# Patient Record
Sex: Female | Born: 1985 | Race: Asian | Hispanic: No | Marital: Single | Smoking: Never smoker
Health system: Southern US, Community
[De-identification: ages and names within clinical notes are randomized; demographics above are authoritative.]

## PROBLEM LIST (undated history)

## (undated) ENCOUNTER — Inpatient Hospital Stay (HOSPITAL_COMMUNITY): Payer: Self-pay

## (undated) DIAGNOSIS — Z789 Other specified health status: Secondary | ICD-10-CM

## (undated) DIAGNOSIS — O329XX1 Maternal care for malpresentation of fetus, unspecified, fetus 1: Secondary | ICD-10-CM

## (undated) DIAGNOSIS — O329XX0 Maternal care for malpresentation of fetus, unspecified, not applicable or unspecified: Secondary | ICD-10-CM

## (undated) HISTORY — DX: Other specified health status: Z78.9

---

## 2009-07-19 ENCOUNTER — Other Ambulatory Visit: Admission: RE | Admit: 2009-07-19 | Discharge: 2009-07-19 | Payer: Self-pay | Admitting: Obstetrics and Gynecology

## 2010-01-22 ENCOUNTER — Inpatient Hospital Stay (HOSPITAL_COMMUNITY): Admission: AD | Admit: 2010-01-22 | Discharge: 2010-01-24 | Payer: Self-pay | Admitting: Obstetrics and Gynecology

## 2010-10-31 LAB — CBC
Hemoglobin: 10.8 g/dL — ABNORMAL LOW (ref 12.0–15.0)
MCHC: 32.3 g/dL (ref 30.0–36.0)
Platelets: 205 10*3/uL (ref 150–400)
RBC: 3.9 MIL/uL (ref 3.87–5.11)
RDW: 15.7 % — ABNORMAL HIGH (ref 11.5–15.5)
WBC: 13.4 10*3/uL — ABNORMAL HIGH (ref 4.0–10.5)

## 2010-10-31 LAB — RPR: RPR Ser Ql: NONREACTIVE

## 2011-06-06 LAB — ABO/RH: RH Type: POSITIVE

## 2011-06-06 LAB — HIV ANTIBODY (ROUTINE TESTING W REFLEX): HIV: NONREACTIVE

## 2011-06-06 LAB — ANTIBODY SCREEN: Antibody Screen: NEGATIVE

## 2011-06-08 ENCOUNTER — Other Ambulatory Visit (HOSPITAL_COMMUNITY): Payer: Self-pay | Admitting: Physician Assistant

## 2011-06-08 DIAGNOSIS — Z3689 Encounter for other specified antenatal screening: Secondary | ICD-10-CM

## 2011-06-13 ENCOUNTER — Ambulatory Visit (HOSPITAL_COMMUNITY)
Admission: RE | Admit: 2011-06-13 | Discharge: 2011-06-13 | Disposition: A | Discharge status: Home / Self Care | Payer: Medicaid Other | Source: Ambulatory Visit | Attending: Physician Assistant | Admitting: Physician Assistant

## 2011-06-13 DIAGNOSIS — Z363 Encounter for antenatal screening for malformations: Secondary | ICD-10-CM | POA: Insufficient documentation

## 2011-06-13 DIAGNOSIS — Z1389 Encounter for screening for other disorder: Secondary | ICD-10-CM | POA: Insufficient documentation

## 2011-06-13 DIAGNOSIS — O358XX0 Maternal care for other (suspected) fetal abnormality and damage, not applicable or unspecified: Secondary | ICD-10-CM | POA: Insufficient documentation

## 2011-06-13 DIAGNOSIS — Z3689 Encounter for other specified antenatal screening: Secondary | ICD-10-CM

## 2011-08-15 NOTE — L&D Delivery Note (Cosign Needed)
Delivery Note At 8:13 AM a viable female was delivered via Vaginal, Spontaneous Delivery (Presentation: Right Occiput Anterior).  APGAR: 9, 9; weight 7 lb 3.7 oz (3280 g).   Placenta status: Intact, Spontaneous.  Cord: 3 vessels with the following complications: None.    Anesthesia: None  Episiotomy: n/a Lacerations: 1st degree;Perineal Suture Repair: 3.0 vicryl rapide Est. Blood Loss (mL): 350  Mom to postpartum.  Baby to nursery-stable.  Plans to breastfeed, unsure about contraception or circumcision.  Cathie Beams, CNM in attendance of birth  Joellyn Haff, [place] 09/22/2011, 9:00 AM

## 2011-08-25 ENCOUNTER — Other Ambulatory Visit: Payer: Self-pay | Admitting: Family Medicine

## 2011-08-25 DIAGNOSIS — N644 Mastodynia: Secondary | ICD-10-CM

## 2011-08-25 DIAGNOSIS — N6320 Unspecified lump in the left breast, unspecified quadrant: Secondary | ICD-10-CM

## 2011-08-30 LAB — GC/CHLAMYDIA PROBE AMP, GENITAL: Chlamydia: NEGATIVE

## 2011-08-31 ENCOUNTER — Ambulatory Visit
Admission: RE | Admit: 2011-08-31 | Discharge: 2011-08-31 | Disposition: A | Discharge status: Home / Self Care | Payer: Medicaid Other | Source: Ambulatory Visit | Attending: Family Medicine | Admitting: Family Medicine

## 2011-08-31 DIAGNOSIS — N644 Mastodynia: Secondary | ICD-10-CM

## 2011-08-31 DIAGNOSIS — N6320 Unspecified lump in the left breast, unspecified quadrant: Secondary | ICD-10-CM

## 2011-09-22 ENCOUNTER — Inpatient Hospital Stay (HOSPITAL_COMMUNITY)
Admission: AD | Admit: 2011-09-22 | Discharge: 2011-09-24 | DRG: 775 | Disposition: A | Discharge status: Home / Self Care | Payer: Medicaid Other | Source: Ambulatory Visit | Attending: Family Medicine | Admitting: Family Medicine

## 2011-09-22 ENCOUNTER — Encounter (HOSPITAL_COMMUNITY): Payer: Self-pay | Admitting: *Deleted

## 2011-09-22 DIAGNOSIS — O9902 Anemia complicating childbirth: Secondary | ICD-10-CM | POA: Diagnosis present

## 2011-09-22 DIAGNOSIS — IMO0001 Reserved for inherently not codable concepts without codable children: Secondary | ICD-10-CM

## 2011-09-22 DIAGNOSIS — Z3490 Encounter for supervision of normal pregnancy, unspecified, unspecified trimester: Secondary | ICD-10-CM

## 2011-09-22 DIAGNOSIS — D569 Thalassemia, unspecified: Secondary | ICD-10-CM

## 2011-09-22 DIAGNOSIS — D582 Other hemoglobinopathies: Secondary | ICD-10-CM

## 2011-09-22 LAB — CBC
HCT: 28.8 % — ABNORMAL LOW (ref 36.0–46.0)
Hemoglobin: 10.2 g/dL — ABNORMAL LOW (ref 12.0–15.0)
MCV: 63.3 fL — ABNORMAL LOW (ref 78.0–100.0)
WBC: 10.5 10*3/uL (ref 4.0–10.5)

## 2011-09-22 LAB — ABO/RH: ABO/RH(D): O POS

## 2011-09-22 MED ORDER — ZOLPIDEM TARTRATE 5 MG PO TABS: 5.0000 mg | ORAL_TABLET | Freq: Every evening | ORAL | Status: DC | PRN

## 2011-09-22 MED ORDER — METHYLERGONOVINE MALEATE 0.2 MG PO TABS
0.2000 mg | ORAL_TABLET | ORAL | Status: DC | PRN
Start: 2011-09-22 — End: 2011-09-24

## 2011-09-22 MED ORDER — OXYCODONE-ACETAMINOPHEN 5-325 MG PO TABS: 1.0000 | ORAL_TABLET | ORAL | Status: DC | PRN

## 2011-09-22 MED ORDER — SENNOSIDES-DOCUSATE SODIUM 8.6-50 MG PO TABS
2.0000 | ORAL_TABLET | Freq: Every day | ORAL | Status: DC
  Administered 2011-09-22 – 2011-09-23 (×2): 2 via ORAL

## 2011-09-22 MED ORDER — DIPHENHYDRAMINE HCL 25 MG PO CAPS: 25.0000 mg | ORAL_CAPSULE | Freq: Four times a day (QID) | ORAL | Status: DC | PRN

## 2011-09-22 MED ORDER — NALBUPHINE SYRINGE 5 MG/0.5 ML: 5.0000 mg | INJECTION | INTRAMUSCULAR | Status: DC | PRN

## 2011-09-22 MED ORDER — IBUPROFEN 600 MG PO TABS: 600.0000 mg | ORAL_TABLET | Freq: Four times a day (QID) | ORAL | Status: DC | PRN

## 2011-09-22 MED ORDER — ONDANSETRON HCL 4 MG PO TABS: 4.0000 mg | ORAL_TABLET | ORAL | Status: DC | PRN

## 2011-09-22 MED ORDER — PRENATAL MULTIVITAMIN CH
1.0000 | ORAL_TABLET | Freq: Every day | ORAL | Status: DC
  Administered 2011-09-23: 1 via ORAL
  Filled 2011-09-22 (×2): qty 1

## 2011-09-22 MED ORDER — LACTATED RINGERS IV SOLN: 500.0000 mL | INTRAVENOUS | Status: DC | PRN

## 2011-09-22 MED ORDER — BENZOCAINE-MENTHOL 20-0.5 % EX AERO
INHALATION_SPRAY | CUTANEOUS | Status: AC
  Filled 2011-09-22: qty 56

## 2011-09-22 MED ORDER — TETANUS-DIPHTH-ACELL PERTUSSIS 5-2.5-18.5 LF-MCG/0.5 IM SUSP
0.5000 mL | Freq: Once | INTRAMUSCULAR | Status: AC
  Administered 2011-09-23: 0.5 mL via INTRAMUSCULAR
  Filled 2011-09-22: qty 0.5

## 2011-09-22 MED ORDER — CITRIC ACID-SODIUM CITRATE 334-500 MG/5ML PO SOLN: 30.0000 mL | ORAL | Status: DC | PRN

## 2011-09-22 MED ORDER — IBUPROFEN 600 MG PO TABS
600.0000 mg | ORAL_TABLET | Freq: Four times a day (QID) | ORAL | Status: DC
  Administered 2011-09-22 – 2011-09-24 (×8): 600 mg via ORAL
  Filled 2011-09-22 (×9): qty 1

## 2011-09-22 MED ORDER — BISACODYL 10 MG RE SUPP: 10.0000 mg | Freq: Every day | RECTAL | Status: DC | PRN

## 2011-09-22 MED ORDER — METHYLERGONOVINE MALEATE 0.2 MG/ML IJ SOLN: 0.2000 mg | INTRAMUSCULAR | Status: DC | PRN

## 2011-09-22 MED ORDER — ACETAMINOPHEN 325 MG PO TABS: 650.0000 mg | ORAL_TABLET | ORAL | Status: DC | PRN

## 2011-09-22 MED ORDER — LACTATED RINGERS IV SOLN
INTRAVENOUS | Status: DC
  Administered 2011-09-22: 06:00:00 via INTRAVENOUS

## 2011-09-22 MED ORDER — ONDANSETRON HCL 4 MG/2ML IJ SOLN: 4.0000 mg | INTRAMUSCULAR | Status: DC | PRN

## 2011-09-22 MED ORDER — OXYTOCIN 20 UNITS IN LACTATED RINGERS INFUSION - SIMPLE: 125.0000 mL/h | Freq: Once | INTRAVENOUS | Status: DC

## 2011-09-22 MED ORDER — SIMETHICONE 80 MG PO CHEW: 80.0000 mg | CHEWABLE_TABLET | ORAL | Status: DC | PRN

## 2011-09-22 MED ORDER — ONDANSETRON HCL 4 MG/2ML IJ SOLN: 4.0000 mg | Freq: Four times a day (QID) | INTRAMUSCULAR | Status: DC | PRN

## 2011-09-22 MED ORDER — WITCH HAZEL-GLYCERIN EX PADS
1.0000 "application " | MEDICATED_PAD | CUTANEOUS | Status: DC | PRN
  Administered 2011-09-22 – 2011-09-24 (×2): 1 via TOPICAL

## 2011-09-22 MED ORDER — LANOLIN HYDROUS EX OINT: TOPICAL_OINTMENT | CUTANEOUS | Status: DC | PRN

## 2011-09-22 MED ORDER — FLEET ENEMA 7-19 GM/118ML RE ENEM: 1.0000 | ENEMA | RECTAL | Status: DC | PRN

## 2011-09-22 MED ORDER — OXYTOCIN BOLUS FROM INFUSION
500.0000 mL | Freq: Once | INTRAVENOUS | Status: AC
  Administered 2011-09-22: 500 mL via INTRAVENOUS
  Filled 2011-09-22: qty 1000
  Filled 2011-09-22: qty 500

## 2011-09-22 MED ORDER — FLEET ENEMA 7-19 GM/118ML RE ENEM: 1.0000 | ENEMA | Freq: Every day | RECTAL | Status: DC | PRN

## 2011-09-22 MED ORDER — LIDOCAINE HCL (PF) 1 % IJ SOLN
30.0000 mL | INTRAMUSCULAR | Status: DC | PRN
  Administered 2011-09-22: 30 mL via SUBCUTANEOUS
  Filled 2011-09-22: qty 30

## 2011-09-22 MED ORDER — OXYCODONE-ACETAMINOPHEN 5-325 MG PO TABS
1.0000 | ORAL_TABLET | ORAL | Status: DC | PRN
  Administered 2011-09-22 – 2011-09-23 (×3): 1 via ORAL
  Filled 2011-09-22 (×3): qty 1

## 2011-09-22 MED ORDER — BENZOCAINE-MENTHOL 20-0.5 % EX AERO
1.0000 "application " | INHALATION_SPRAY | CUTANEOUS | Status: DC | PRN
  Administered 2011-09-22 – 2011-09-24 (×2): 1 via TOPICAL

## 2011-09-22 MED ORDER — DIBUCAINE 1 % RE OINT
1.0000 "application " | TOPICAL_OINTMENT | RECTAL | Status: DC | PRN
  Administered 2011-09-22 – 2011-09-24 (×2): 1 via RECTAL
  Filled 2011-09-22 (×2): qty 28

## 2011-09-22 NOTE — Progress Notes (Signed)
contractions 

## 2011-09-22 NOTE — H&P (Signed)
Brittany Wilson is a 26 y.o. G8P1001 [place] female at [redacted]w[redacted]d presenting in active labor.  Onset of care at 18.6wks at [facility].  H/O GDM w/ 1st pregnancy, 1st trimester 1hr glucola elevated this pregnancy, but passed only 1 abnormal value on 3hr and subsequent normal 28wk 1hr.  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   Maternal Medical History:  Reason for admission: Reason for admission: contractions.  Contractions: Onset was 3-5 hours ago.   Frequency: regular.   Perceived severity is strong.    Fetal activity: Perceived fetal activity is normal.   Last perceived fetal movement was within the past hour.    Prenatal complications: no prenatal complications Prenatal Complications - Diabetes: none. H/o GDM; 1st trimester 1hr this pregnancy 136, 3hr 62, 129, 200, 89; 28wk 1hr 97  OB History    Grav Para Term Preterm Abortions TAB SAB Ect Mult Living   2 1 1       1      History reviewed. No pertinent past medical history. History reviewed. No pertinent past surgical history. Family History: family history is not on file. Social History:  reports that she has never smoked. She does not have any smokeless tobacco history on file. She reports that she does not drink alcohol or use illicit drugs.  Review of Systems  Constitutional: Negative.   HENT: Negative.   Eyes: Negative.   Respiratory: Negative.   Cardiovascular: Negative.   Gastrointestinal: Positive for abdominal pain (UCs).  Genitourinary: Negative for dysuria, urgency, frequency, hematuria and flank pain.       Bloody show  Musculoskeletal: Negative.   Skin: Negative.   Neurological: Negative.   Endo/Heme/Allergies: Negative.   Psychiatric/Behavioral: Negative.     Dilation: 6.5 Effacement (%): 90 Station: -1 Blood pressure 118/77, pulse 88, resp. rate 18, height 5' (1.524 m), weight 56.7 kg (125 lb), SpO2 99.00%. Maternal Exam:  Uterine Assessment: Contraction strength is firm.  Contraction  frequency is irregular.   Abdomen: Patient reports no abdominal tenderness. Introitus: Normal vulva. Normal vagina.    Physical Exam  Constitutional: She is oriented to person, place, and time. She appears well-developed and well-nourished.  HENT:  Head: Normocephalic.  Eyes: Pupils are equal, round, and reactive to light.  Neck: Normal range of motion.  Cardiovascular: Normal rate and regular rhythm.   Respiratory: Effort normal and breath sounds normal.  GI: Soft.       Gravid  Genitourinary: Vagina normal and uterus normal.  Musculoskeletal: Normal range of motion.  Neurological: She is alert and oriented to person, place, and time. She has normal reflexes.  Skin: Skin is warm and dry.  Psychiatric: She has a normal mood and affect. Her behavior is normal. Judgment and thought content normal.    Prenatal labs: ABO, Rh:   O+ Antibody:  Neg Rubella:  Immune RPR:   Neg HBsAg:   Neg HIV:   NR GBS:   Neg  Assessment/Plan: A: IUP @ 39.2wks     Active labor-intact membranes     GBS neg     Hgb EE E-Thalassemia     Anemia      P: Admit to BS     Expectant management     IV pain meds or epidural upon maternal request      Pt declines AROM at this time  POC reviewed w/ Cathie Beams, CNM       Joellyn Haff, SNM 09/22/2011, 5:57 AM

## 2011-09-22 NOTE — Progress Notes (Signed)
UR chart review completed.  

## 2011-09-23 NOTE — Progress Notes (Signed)
Post Partum Day #1 Subjective: no complaints and up ad lib; bottlefeeding; desires Depo for contraception; declines early d/c  Objective: Blood pressure 88/58, pulse 76, temperature 97.6 F (36.4 C), temperature source Oral, resp. rate 17, height 5' (1.524 m), weight 56.7 kg (125 lb), SpO2 98.00%, unknown if currently breastfeeding.  Physical Exam:  General: alert, cooperative and no distress Lochia: appropriate Uterine Fundus: firm    Basename 09/22/11 0620  HGB 10.2*  HCT 28.8*    Assessment/Plan: Plan for discharge tomorrow Will f/u at [facility] for depo inj   LOS: 1 day   Cam Hai 09/23/2011, 8:56 AM

## 2011-09-24 DIAGNOSIS — Z3490 Encounter for supervision of normal pregnancy, unspecified, unspecified trimester: Secondary | ICD-10-CM

## 2011-09-24 DIAGNOSIS — D582 Other hemoglobinopathies: Secondary | ICD-10-CM

## 2011-09-24 MED ORDER — IBUPROFEN 600 MG PO TABS: 600.0000 mg | ORAL_TABLET | Freq: Four times a day (QID) | ORAL | Status: AC

## 2011-09-24 MED ORDER — BENZOCAINE-MENTHOL 20-0.5 % EX AERO
INHALATION_SPRAY | CUTANEOUS | Status: AC
  Filled 2011-09-24: qty 56

## 2011-09-24 NOTE — Discharge Summary (Signed)
Obstetric Discharge Summary Reason for Admission: onset of labor Prenatal Procedures: NST Intrapartum Procedures: spontaneous vaginal delivery Postpartum Procedures: none Complications-Operative and Postpartum: none Hemoglobin  Date Value Range Status  09/22/2011 10.2* 12.0-15.0 (g/dL) Final     HCT  Date Value Range Status  09/22/2011 28.8* 36.0-46.0 (%) Final   Patient was admitted in active labor and delivered 2 hrs later.  At 8:13 AM a viable female was delivered via Vaginal, Spontaneous Delivery (Presentation: Right Occiput Anterior).  APGAR: 9, 9; weight 7 lb 3.7 oz (3280 g).   Placenta status: Intact, Spontaneous.  Cord: 3 vessels with the following complications: None.    PE:  HR RRR         Abd soft and nontender         Fundus firm, lochia WNL         Ext WNL  Discharge Diagnoses: Term Pregnancy-delivered  Discharge Information: Date: 09/24/2011 Activity: unrestricted and pelvic rest Diet: routine Medications: Ibuprofen Condition: stable Instructions: refer to practice specific booklet Discharge to: home   Newborn Data: Live born female  Birth Weight: 7 lb 3.7 oz (3280 g) APGAR: 9, 9  Home with mother. Bottle feeding here, breast at home Undecided contraception  [facility] 09/24/2011, 5:32 AM

## 2011-09-25 NOTE — H&P (Signed)
Attestation of Attending Supervision of Advanced Practitioner: Evaluation and management procedures were performed by the PA/NP/CNM/OB Fellow under my supervision/collaboration. Chart reviewed, and agree with management and plan.  UGONNA ANYANWU, M.D. 09/25/2011 1:41 PM   

## 2013-06-13 IMAGING — US US OB DETAIL+14 WK
1 series · 12 of 28 positions shown · non-contrast
Comparison: none

[Series 1: us ob detail +14 wk · 69 acquisitions, 12 frames shown]
[im 3/69]
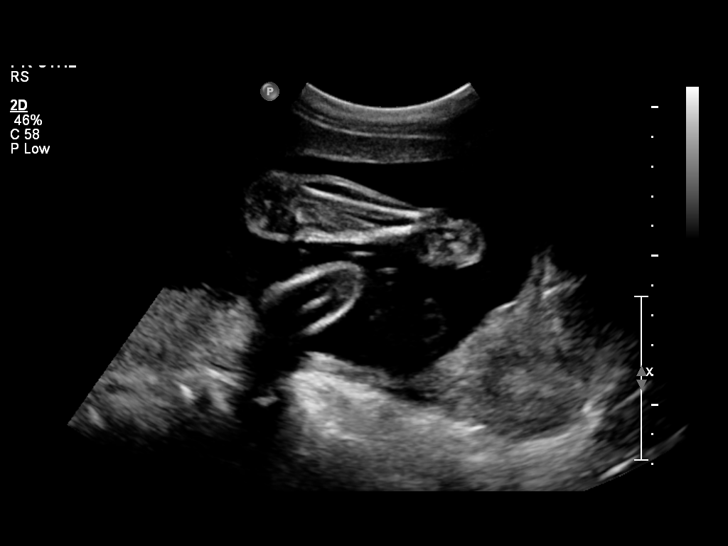
[im 8/69]
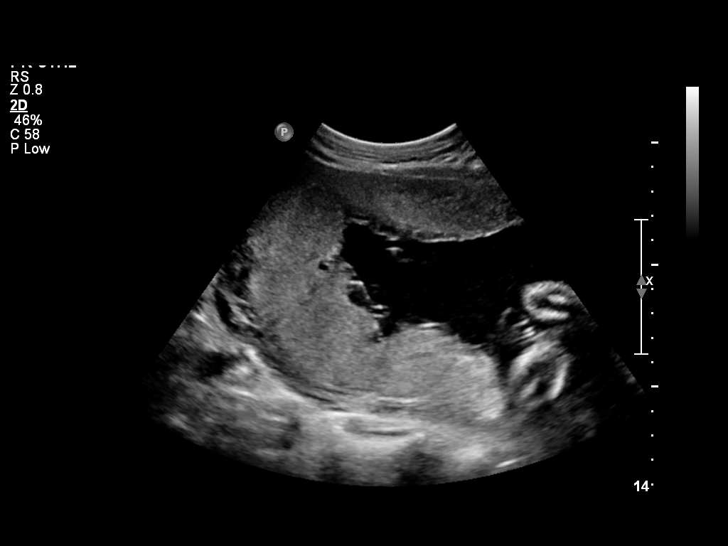
[im 13/69]
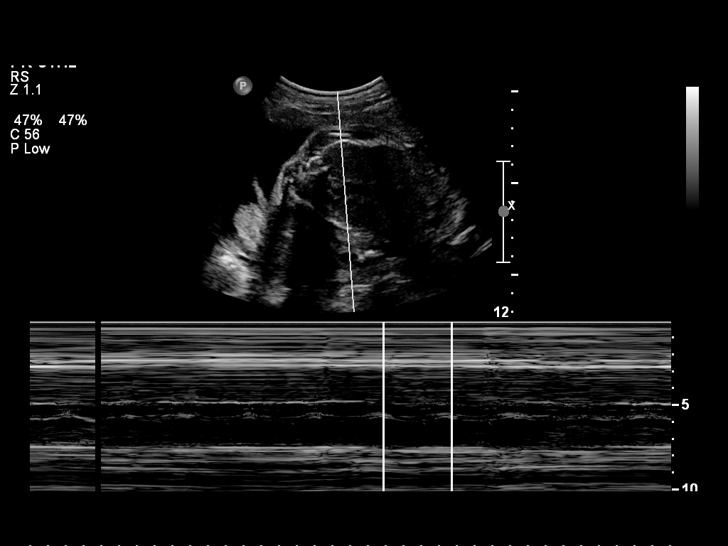
[im 21/69]
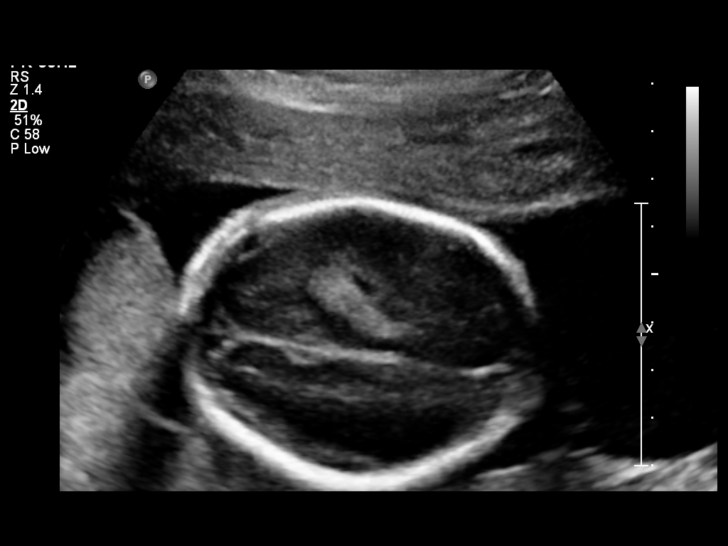
[im 26/69]
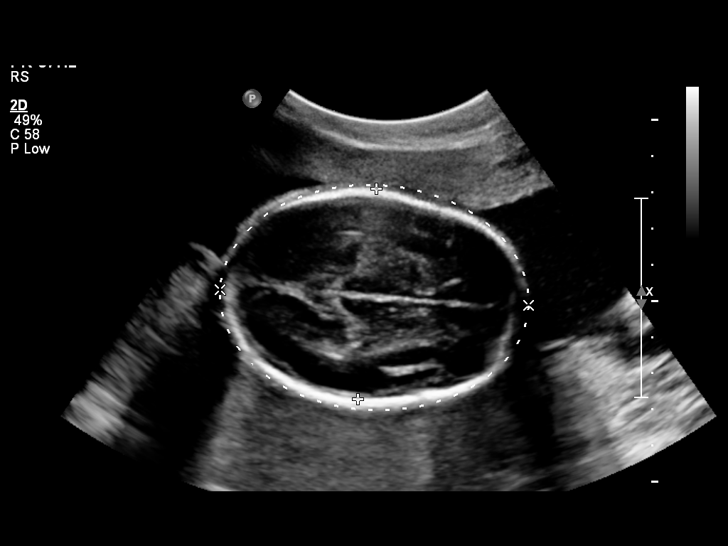
[im 31/69]
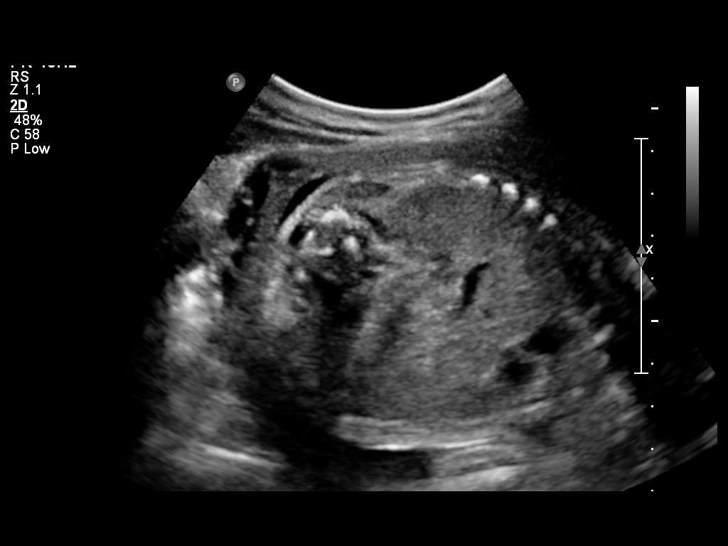
[im 38/69]
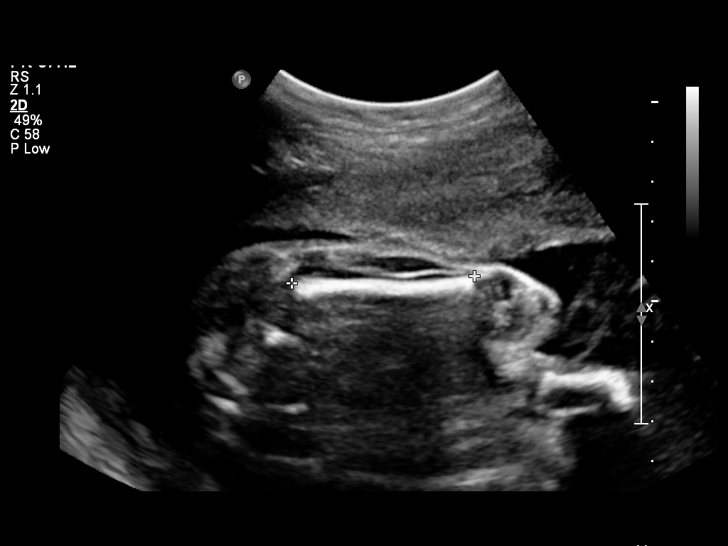
[im 43/69]
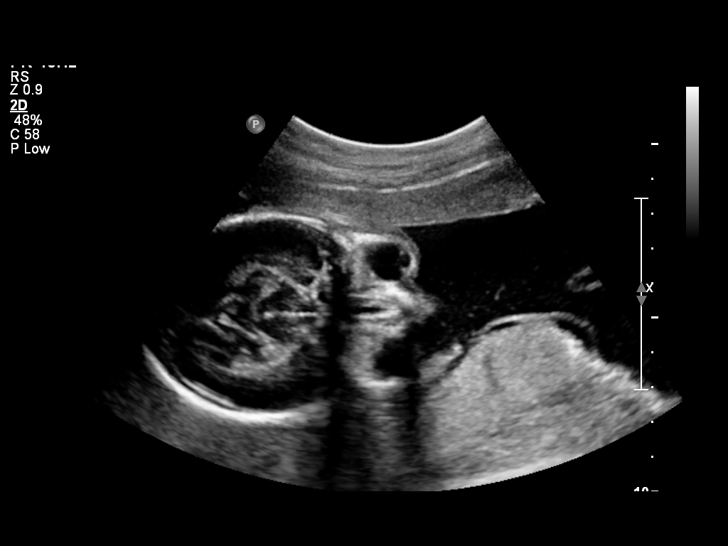
[im 48/69]
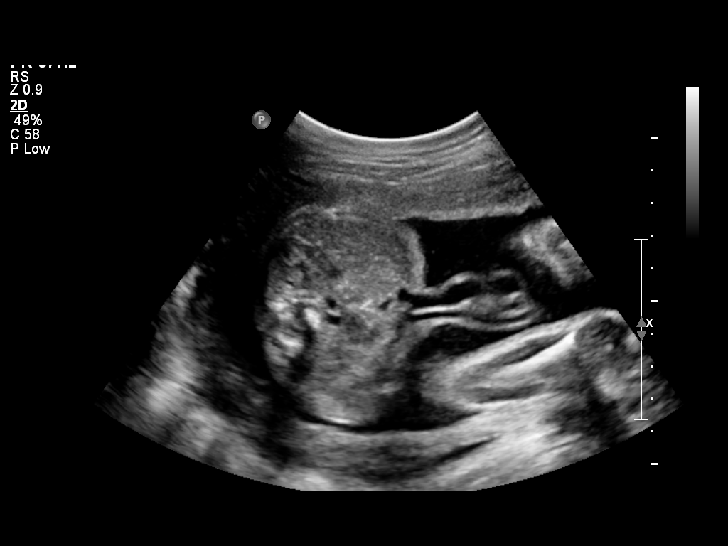
[im 56/69]
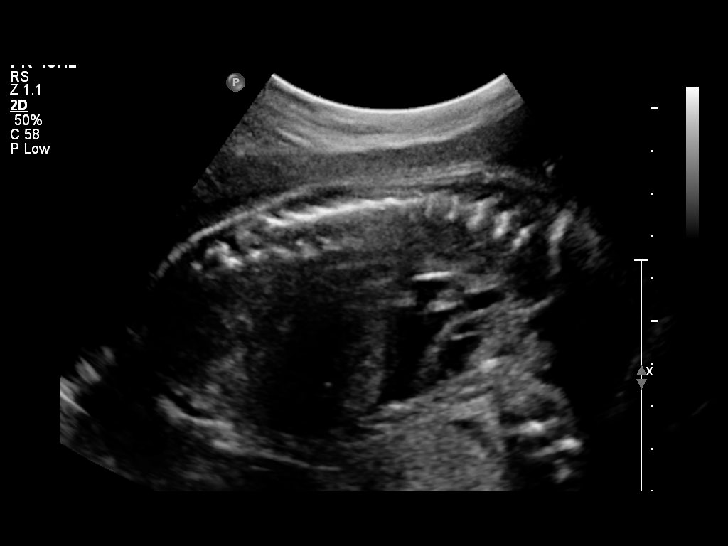
[im 61/69]
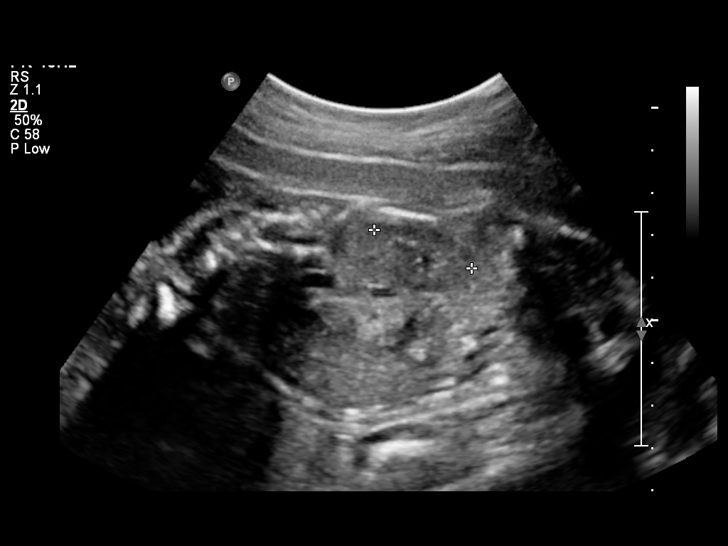
[im 66/69]
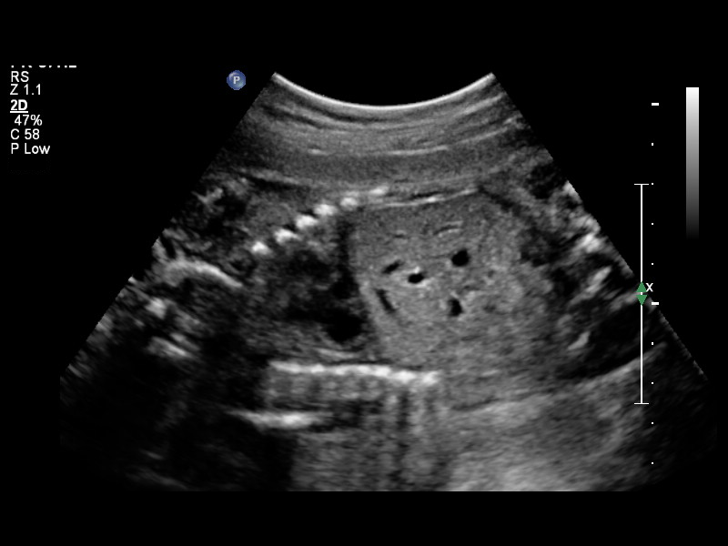

[12 of 28 positions shown; findings below may reference images not displayed]

OBSTETRICS REPORT
                      (Signed Final 06/13/2011 [DATE])

                 PA
 Order#:         [id number]
Procedures

 US OB DETAIL + 14 WK                                  76811.0
Indications

 Detailed fetal anatomic survey
 Uncertain LMP;  Establish Gestational [AGE]
Fetal Evaluation

 Fetal Heart Rate:  156                          bpm
 Cardiac Activity:  Observed
 Presentation:      Breech
 P. Cord Insertion: Visualized

 Amniotic Fluid
 AFI FV:      Subjectively within normal limits
                                              Larg Pckt:    6.1  cm
Biometry

 BPD:     58.2  mm     G. Age:  23w 6d                CI:        65.82   70 - 86
                                                      FL/HC:       19.6  18.7 -

 HC:     230.2  mm     G. Age:  25w 0d       37  %    HC/AC:       1.11  1.04 -

 AC:     207.2  mm     G. Age:  25w 2d       55  %    FL/BPD:      77.7  71 - 87
 FL:      45.2  mm     G. Age:  25w 0d       40  %    FL/AC:       21.8  20 - 24
 HUM:     43.1  mm     G. Age:  25w 5d       66  %

 Est. FW:     766  gm     1 lb 11 oz     57  %
Gestational Age

 U/S Today:     24w 6d                                        EDD:   09/27/11
 Best:          24w 6d     Det. By:  U/S (06/13/11)           EDD:   09/27/11
Genetic Sonogram - Trisomy 21 Screening

 Age:                                              26         Risk=1:   769
 Echogenic bowel:                                  No         LR :
 Hypoplastic/absent Nasal bone:                    No
 Choroid plexus cysts:                             No
 Structural anomalies (inc. cardiac):              No         LR :
 Hypoplastic / absent midphalanx 5th Digit:        No
 Short femur:                                      No         LR :
 Wide space 5st-7nd toes:                          No
 Short humerus:                                    No         LR :
 2-vessel umbilical cord:                          No
 Pyelectasis:                                      No         LR :
 Echogenic cardiac foci:                           No         LR :

 11 Of 11 Criteria Were Visualized and 0 Abnormal(s) Were Seen.
 Ultrasound Modified Risk for Fetal Down Syndrome = [DATE]
Anatomy

 Cranium:           Appears normal      Aortic Arch:       Appears normal
 Fetal Cavum:       Appears normal      Ductal Arch:       Not well
                                                           visualized
 Ventricles:        Appears normal      Diaphragm:         Appears normal
 Choroid Plexus:    Appears normal      Stomach:           Appears normal,
                                                           left sided
 Cerebellum:        Appears normal      Abdomen:           Appears normal
 Posterior Fossa:   Appears normal      Abdominal Wall:    Appears nml
                                                           (cord insert, abd
                                                           wall)
 Nuchal Fold:       Not applicable      Cord Vessels:      Appears normal
                    (>20 wks GA)                           (3 vessel cord)
 Face:              Appears normal      Kidneys:           Appear normal
                    (lips/profile/orbits
                    )
 Heart:             Appears normal      Bladder:           Appears normal
                    (4 chamber &
                    axis)
 RVOT:              Appears normal      Spine:             Appears normal
 LVOT:              Appears normal      Limbs:             Four extremities
                                                           seen

 Other:     Male gender. Heels visualized. Technically difficult due to
            advanced GA and fetal position.
Cervix Uterus Adnexa

 Cervical Length:    3.6       cm

 Cervix:       Closed. Normal appearance by transabdominal scan.

 Adnexa:     No abnormality visualized.
Impression

   Single living intrauterine gestation with sonographic
 gestational age today of 76w8d and normal visualized
 anatomy. No sonographic markers for aneuploidy visualized;
 see above for US modified Trisomy 21 risk calculation.

 questions or concerns.

## 2014-06-15 ENCOUNTER — Encounter (HOSPITAL_COMMUNITY): Payer: Self-pay | Admitting: *Deleted

## 2016-11-20 ENCOUNTER — Inpatient Hospital Stay (HOSPITAL_COMMUNITY)
Admission: AD | Admit: 2016-11-20 | Discharge: 2016-11-20 | Disposition: A | Discharge status: Home / Self Care | Payer: BLUE CROSS/BLUE SHIELD | Source: Ambulatory Visit | Attending: Obstetrics and Gynecology | Admitting: Obstetrics and Gynecology

## 2016-11-20 ENCOUNTER — Inpatient Hospital Stay (HOSPITAL_COMMUNITY): Payer: BLUE CROSS/BLUE SHIELD

## 2016-11-20 ENCOUNTER — Encounter (HOSPITAL_COMMUNITY): Payer: Self-pay

## 2016-11-20 DIAGNOSIS — R109 Unspecified abdominal pain: Secondary | ICD-10-CM | POA: Diagnosis present

## 2016-11-20 DIAGNOSIS — O26891 Other specified pregnancy related conditions, first trimester: Secondary | ICD-10-CM | POA: Diagnosis present

## 2016-11-20 DIAGNOSIS — O039 Complete or unspecified spontaneous abortion without complication: Secondary | ICD-10-CM | POA: Diagnosis not present

## 2016-11-20 LAB — URINALYSIS, ROUTINE W REFLEX MICROSCOPIC
Bilirubin Urine: NEGATIVE
Glucose, UA: NEGATIVE mg/dL
Hgb urine dipstick: NEGATIVE
Ketones, ur: NEGATIVE mg/dL
Nitrite: NEGATIVE
PROTEIN: NEGATIVE mg/dL
Specific Gravity, Urine: 1.014 (ref 1.005–1.030)
pH: 5 (ref 5.0–8.0)

## 2016-11-20 LAB — POCT PREGNANCY, URINE: PREG TEST UR: POSITIVE — AB

## 2016-11-20 NOTE — Discharge Instructions (Signed)
Incomplete Miscarriage °A miscarriage is the sudden loss of an unborn baby (fetus) before the 20th week of pregnancy. In an incomplete miscarriage, parts of the fetus or placenta (afterbirth) remain in the body. °Having a miscarriage can be an emotional experience. Talk with your health care provider about any questions you may have about miscarrying, the grieving process, and your future pregnancy plans. °What are the causes? °· Problems with the fetal chromosomes that make it impossible for the baby to develop normally. Problems with the baby's genes or chromosomes are most often the result of errors that occur by chance as the embryo divides and grows. The problems are not inherited from the parents. °· Infection of the cervix or uterus. °· Hormone problems. °· Problems with the cervix, such as having an incompetent cervix. This is when the tissue in the cervix is not strong enough to hold the pregnancy. °· Problems with the uterus, such as an abnormally shaped uterus, uterine fibroids, or congenital abnormalities. °· Certain medical conditions. °· Smoking, drinking alcohol, or taking illegal drugs. °· Trauma. °What are the signs or symptoms? °· Vaginal bleeding or spotting, with or without cramps or pain. °· Pain or cramping in the abdomen or lower back. °· Passing fluid, tissue, or blood clots from the vagina. °How is this diagnosed? °Your health care provider will perform a physical exam. You may also have an ultrasound to confirm the miscarriage. Blood or urine tests may also be ordered. °How is this treated? °· Usually, a dilation and curettage (D&C) procedure is performed. During a D&C procedure, the cervix is widened (dilated) and any remaining fetal or placental tissue is gently removed from the uterus. °· Antibiotic medicines are prescribed if there is an infection. Other medicines may be given to reduce the size of the uterus (contract) if there is a lot of bleeding. °· If you have Rh negative blood and  your baby was Rh positive, you will need a Rho (D) immune globulin shot. This shot will protect any future baby from having Rh blood problems in future pregnancies. °· You may be confined to bed rest. This means you should stay in bed and only get up to use the bathroom. °Follow these instructions at home: °· Rest as directed by your health care provider. °· Restrict activity as directed by your health care provider. You may be allowed to continue light activity if curettage was not done but you require further treatment. °· Keep track of the number of pads you use each day. Keep track of how soaked (saturated) they are. Record this information. °· Do not  use tampons. °· Do not douche or have sexual intercourse until approved by your health care provider. °· Keep all follow-up appointments for reevaluation and continuing management. °· Only take over-the-counter or prescription medicines for pain, fever, or discomfort as directed by your health care provider. °· Take antibiotic medicine as directed by your health care provider. Make sure you finish it even if you start to feel better. °Get help right away if: °· You experience severe cramps in your stomach, back, or abdomen. °· You have an unexplained temperature (make sure to record these temperatures). °· You pass large clots or tissue (save these for your health care provider to inspect). °· Your bleeding increases. °· You become light-headed, weak, or have fainting episodes. °This information is not intended to replace advice given to you by your health care provider. Make sure you discuss any questions you have with your health   care provider. Document Released: 07/31/2005 Document Revised: 01/06/2016 Document Reviewed: 02/27/2013 Elsevier Interactive Patient Education  2017 [employer]  FACTS YOU SHOULD KNOW about Cytotec   WHAT IS AN EARLY PREGNANCY FAILURE? Once the egg is fertilized with the sperm and begins to develop, it attaches to the lining  of the uterus. This early pregnancy tissue may not develop into an embryo (the beginning stage of a baby). Sometimes an embryo does develop but does not continue to grow. These problems can be seen on ultrasound.   MANAGEMNT OF EARLY PREGNANCY FAILURE: About 4 out of 100 (0.25%) women will have a pregnancy loss in her lifetime.  One in five pregnancies is found to be an early pregnancy failure.  There are 3 ways to care for an early pregnancy failure:   (1) Surgery, (2) Medicine, (3) Waiting for you to pass the pregnancy on your own. The decision as to how to proceed after being diagnosed with and early pregnancy failure is an individual one.  The decision can be made only after appropriate counseling.  You need to weigh the pros and cons of the 3 choices. Then you can make the choice that works for you.  SURGERY (D&E)  Procedure over in 1 day  Requires being put to sleep  Bleeding may be light  Possible problems during surgery, including injury to womb(uterus)  Care provider has more control   Medicine (CYTOTEC)  The complete procedure may take days to weeks  No Surgery  Bleeding may be heavy at times  There may be drug side effects  Patient has more control   Waiting  You may choose to wait, in which case your own body may complete the passing of the abnormal early pregnancy on its own in about 2-4 weeks  Your bleeding may be heavy at times  There is a small possibility that you may need surgery if the bleeding is too much or not all of the pregnancy has passed.   CYTOTEC MANAGEMENT Prostaglandins (cytotec) are the most widely used drug for this purpose. They cause the uterus to cramp and contract. You will place the medicine yourself inside your vagina in the privacy of your home. Empting of the uterus should occur within 3 days but the process may continue for several weeks. The bleeding may seem heavy at times.  POSSIBLE SIDE EFFECTS FROM CYTOTEC  Nausea    Vomiting  Diarrhea Fever  Chills  Hot Flashes Side effects  from the process of the early pregnancy failure include:  Cramping  Bleeding  Headaches  Dizziness   RISKS: This is a low risk procedure. Less than 1 in 100 women has a complication. An incomplete passage of the early pregnancy may occur. Also, Hemorrhage (heavy bleeding) could happen.  Rarely the pregnancy will not be passed completely. Excessively heavy bleeding may occur.  Your doctor may need to perform surgery to empty the uterus (D&E).  Afterwards: Everybody will feel differently after the early pregnancy completion. You may have soreness or cramps for a day or two. You may have soreness or cramps for day or two.  You may have light bleeding for up to 2 weeks. You may be as active as you feel like being.  If you have any of the following problems you may call Maternity Admissions Unit at [phone].  If you have pain that does not get better  with pain medication  Bleeding that soaks through 2 thick full-sized sanitary pads in an hour  Cramps  that last longer than 2 days  Foul smelling discharge  Fever above 100.4 degrees F   Even if you do not have any of these symptoms, you should have a follow-up exam to make sure you are healing properly. This appointment will be made for you before you leave the hospital. Your next normal period will start again in 4-6 week after the loss. You can get pregnant soon after the loss, so use birth control right away.  Finally: Make sure all your questions are answered before during and after any procedure. Follow up with medical care and family planning methods.

## 2016-11-20 NOTE — MAU Note (Signed)
Pt states she came into see if baby has a heartbeat.  States she went [facility] last Wednesday and was told that no FHR.  Pt wants a second opinion.

## 2016-11-20 NOTE — MAU Provider Note (Signed)
Chief Complaint: Abdominal Pain   First Provider Initiated Contact with Patient 11/20/16 1827        SUBJECTIVE HPI: Brittany Wilson is a 31 y.o. G3P2002 at [redacted]w[redacted]d by LMP who presents to maternity admissions reporting wanting a second opinion on her fetal demise. Told RN she had a cough, but tells me she is not here for that, simply hopes that the US done last week was wrong and that her baby is alive.  They gave her Rx for Cytotec but she did not take it due to hoping it is not true.. She denies vaginal bleeding, vaginal itching/burning, urinary symptoms, h/a, dizziness, n/v, or fever/chills.    Abdominal Pain  This is a new problem. The current episode started in the past 7 days. The onset quality is gradual. The problem occurs intermittently. The problem has been unchanged. The pain is located in the suprapubic region. The quality of the pain is cramping. The abdominal pain does not radiate. Pertinent negatives include no constipation, diarrhea, fever, headaches, myalgias, nausea or vomiting. Nothing aggravates the pain. The pain is relieved by nothing. She has tried nothing for the symptoms.   RN Note: Pt states she came into see if baby has a heartbeat.  States she went [facility] last Wednesday and was told that no FHR.  Pt wants a second opinion. Pt was here last week and was told on the ultrasound that the baby doesn't have a heartbeat. Pt states she has had intermittent abdominal pain since last week. Pt states she doesn't have pain now but last nigh the pain was bad. Pt c/o a cough for the last week. Pt has not taken any cough medicine.  History reviewed. No pertinent past medical history. History reviewed. No pertinent surgical history. Social History   Social History  . Marital status: Single    Spouse name: N/A  . Number of children: N/A  . Years of education: N/A   Occupational History  . Not on file.   Social History Main Topics  . Smoking status: Never Smoker  .  Smokeless tobacco: Not on file  . Alcohol use No  . Drug use: No  . Sexual activity: Yes   Other Topics Concern  . Not on file   Social History Narrative  . No narrative on file   No current facility-administered medications on file prior to encounter.    Current Outpatient Prescriptions on File Prior to Encounter  Medication Sig Dispense Refill  . acetaminophen (TYLENOL) 325 MG tablet Take 650 mg by mouth every 6 (six) hours as needed. For pain    . Prenatal Vit-Fe Fumarate-FA (PRENATAL MULTIVITAMIN) TABS Take 1 tablet by mouth daily.     No Known Allergies  I have reviewed patient's Past Medical Hx, Surgical Hx, Family Hx, Social Hx, medications and allergies.   ROS:  Review of Systems  Constitutional: Negative for fever.  Gastrointestinal: Positive for abdominal pain. Negative for constipation, diarrhea, nausea and vomiting.  Musculoskeletal: Negative for myalgias.  Neurological: Negative for headaches.   Review of Systems  Other systems negative   Physical Exam  Physical Exam Patient Vitals for the past 24 hrs:  BP Temp Temp src Pulse Resp SpO2 Height Weight  11/20/16 1722 97/69 98.6 F (37 C) Oral 83 18 100 %  (1.549 m) 111 lb (50.3 kg)   Constitutional: Well-developed, well-nourished female in no acute distress.  Cardiovascular: normal rate Respiratory: normal effort GI: Abd soft, non-tender. Pos BS x 4 MS:  Extremities nontender, no edema, normal ROM Neurologic: Alert and oriented x 4.  GU: Neg CVAT.  PELVIC EXAM: Deferred  LAB RESULTS Results for orders placed or performed during the hospital encounter of 11/20/16 (from the past 24 hour(s))  Urinalysis, Routine w reflex microscopic     Status: Abnormal   Collection Time: 11/20/16  5:20 PM  Result Value Ref Range   Color, Urine YELLOW YELLOW   APPearance CLEAR CLEAR   Specific Gravity, Urine 1.014 1.005 - 1.030   pH 5.0 5.0 - 8.0   Glucose, UA NEGATIVE NEGATIVE mg/dL   Hgb urine dipstick NEGATIVE  NEGATIVE   Bilirubin Urine NEGATIVE NEGATIVE   Ketones, ur NEGATIVE NEGATIVE mg/dL   Protein, ur NEGATIVE NEGATIVE mg/dL   Nitrite NEGATIVE NEGATIVE   Leukocytes, UA TRACE (A) NEGATIVE   RBC / HPF 0-5 0 - 5 RBC/hpf   WBC, UA 0-5 0 - 5 WBC/hpf   Bacteria, UA RARE (A) NONE SEEN   Squamous Epithelial / LPF 0-5 (A) NONE SEEN   Mucous PRESENT   Pregnancy, urine POC     Status: Abnormal   Collection Time: 11/20/16  5:33 PM  Result Value Ref Range   Preg Test, Ur POSITIVE (A) NEGATIVE       IMAGING US Ob Comp Less 14 Wks  Result Date: 11/20/2016 CLINICAL DATA:  Cramping for 1 week. No vaginal bleeding. Clinical data reports absence of fetal heart tones in office last week. EXAM: OBSTETRIC <14 WK ULTRASOUND TECHNIQUE: Transabdominal ultrasound was performed for evaluation of the gestation as well as the maternal uterus and adnexal regions. COMPARISON:  None. FINDINGS: Intrauterine gestational sac: Single Yolk sac:  None Embryo:  Single Cardiac Activity: Absent Heart Rate: n/a bpm CRL:   19.1  mm   8 w 3 d                  [place] EDC: 06/29/2017 Subchorionic hemorrhage:  None visualized. Maternal uterus/adnexae: Unremarkable No free fluid. IMPRESSION: Crown-rump length of greater than 7 mm with no visible heartbeat. Findings meet definitive criteria for failed pregnancy. This follows SRU consensus guidelines: Diagnostic Criteria for Nonviable Pregnancy Early in the First Trimester. [facility] Mis J Med [phone]. Electronically Signed   By: Elsie Stain M.D.   On: 11/20/2016 19:33     MAU Management/MDM: [place] ordered per patient request Discussed findings confirm IUFD Discussed Cytotec process and written info given. Advised to followup with office Consulted Dr Senaida Ores who agrees with plan.  ASSESSMENT 1. Fetal demise due to miscarriage   2.    Single IUP at [redacted]w[redacted]d by LMP, fetus is [redacted]w[redacted]d size  PLAN Discharge home Follow up in office Bleeding precautions reviewed  Pt stable at time of  discharge. Encouraged to return here or to other Urgent Care/ED if she develops worsening of symptoms, increase in pain, fever, or other concerning symptoms.    Wynelle Bourgeois CNM, MSN Certified Nurse-Midwife 11/20/2016  7:30 PM

## 2016-11-20 NOTE — MAU Note (Signed)
Pt was here last week and was told on the ultrasound that the baby doesn't have a heartbeat. Pt states she has had intermittent abdominal pain since last week. Pt states she doesn't have pain now but last nigh the pain was bad. Pt c/o a cough for the last week. Pt has not taken any cough medicine.

## 2017-09-07 ENCOUNTER — Encounter (HOSPITAL_COMMUNITY): Payer: Self-pay

## 2017-09-20 LAB — OB RESULTS CONSOLE GC/CHLAMYDIA
Chlamydia: NEGATIVE
GC PROBE AMP, GENITAL: NEGATIVE

## 2017-09-20 LAB — OB RESULTS CONSOLE RUBELLA ANTIBODY, IGM: Rubella: IMMUNE

## 2017-09-20 LAB — OB RESULTS CONSOLE HIV ANTIBODY (ROUTINE TESTING): HIV: NONREACTIVE

## 2017-09-20 LAB — OB RESULTS CONSOLE HEPATITIS B SURFACE ANTIGEN: Hepatitis B Surface Ag: NEGATIVE

## 2017-09-20 LAB — OB RESULTS CONSOLE ABO/RH: RH Type: POSITIVE

## 2017-09-20 LAB — OB RESULTS CONSOLE RPR: RPR: NONREACTIVE

## 2017-09-20 LAB — OB RESULTS CONSOLE ANTIBODY SCREEN: ANTIBODY SCREEN: NEGATIVE

## 2018-03-05 LAB — OB RESULTS CONSOLE GBS: STREP GROUP B AG: NEGATIVE

## 2018-03-25 ENCOUNTER — Telehealth (HOSPITAL_COMMUNITY): Payer: Self-pay | Admitting: *Deleted

## 2018-03-25 NOTE — Telephone Encounter (Signed)
Preadmission screen  

## 2018-03-26 ENCOUNTER — Encounter (HOSPITAL_COMMUNITY): Payer: Self-pay | Admitting: *Deleted

## 2018-03-26 MED ORDER — TERBUTALINE SULFATE 1 MG/ML IJ SOLN
0.2500 mg | Freq: Once | INTRAMUSCULAR | Status: AC
  Administered 2018-03-27: 0.25 mg via SUBCUTANEOUS
  Filled 2018-03-26: qty 1

## 2018-03-26 NOTE — H&P (Addendum)
Brittany Wilson is a 32 y.o. female [id number] at  7039 0/7 weeks (EDD 04/03/18 by 12 week US)  presenting for versbreech ion and possible IOL to follow if successful vs c-section if not.  US in office 03/25/18 with footling breech and head in maternal left upper quadrant.  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  There was a fetal arrhythmia noted on the anatomy US,  as well as EIF and CP cyst.  Normal first trimester screen, normall fetal echo 11/15/17; panorama low risk female.   OB History    Gravida  4   Para  2   Term  2   Preterm      AB  1   Living  2     SAB  1   TAB      Ectopic      Multiple      Live Births  2         2011 NSVD 2013 NSVD  SAB x 1  Past Medical History:  Diagnosis Date  . Medical history non-contributory    No past surgical history on file. Family History: family history includes Liver cancer in her maternal grandfather. Social History:  reports that she has never smoked. She has never used smokeless tobacco. She reports that she does not drink alcohol or use drugs.     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  Gastrointestinal: Negative for abdominal pain.  Neurological: Negative for headaches.   Maternal Medical History:  Contractions: Frequency: irregular.   Perceived severity is mild.    Fetal activity: Perceived fetal activity is normal.    Prenatal Complications - Diabetes: none.      Last menstrual period 07/10/2017, unknown if currently  breastfeeding. Maternal Exam:  Uterine Assessment: Contraction strength is mild.  Contraction frequency is irregular.   Abdomen: Fetal presentation: breech  Introitus: Normal vulva. Normal vagina.    Physical Exam  Constitutional: She appears well-developed.  Cardiovascular: Normal rate and regular rhythm.  Respiratory: Effort normal.  GI: Soft.  Genitourinary: Vagina normal.  Neurological: She is alert.  Psychiatric: She has a normal mood and affect.    Prenatal labs: ABO, Rh: O/Positive/-- (02/07 0000) Antibody: Negative (02/07 0000) Rubella: Immune (02/07 0000) RPR: Nonreactive (02/07 0000)  HBsAg: Negative (02/07 0000)  HIV: Non-reactive (02/07 0000)  GBS: Negative (07/23 0000)  One hour GCT 108 Essential panel WNL  Assessment/Plan: Pt counseled on her options of expectant management, scheduled c-section or attempted version with IOL and would like to proceed with version and IOL if successful or c-section if not.  Scheduled for version at 800AM.   Oliver PilaKathy W Richardson 03/26/2018, 8:48 PM   GC neg/Chl neg/Panorama low risk female/Ur cx neg/glucola 108/Hgb E/essentialpanel neg (CF, SMA and Fragile X neg/  Dated by 12 week scan  SVE 1/30/-3

## 2018-03-26 NOTE — Telephone Encounter (Signed)
Interpreter number [phone]

## 2018-03-27 ENCOUNTER — Other Ambulatory Visit: Payer: Self-pay

## 2018-03-27 ENCOUNTER — Encounter (HOSPITAL_COMMUNITY): Payer: Self-pay

## 2018-03-27 ENCOUNTER — Inpatient Hospital Stay (HOSPITAL_COMMUNITY): Payer: BLUE CROSS/BLUE SHIELD | Admitting: Anesthesiology

## 2018-03-27 ENCOUNTER — Inpatient Hospital Stay (HOSPITAL_COMMUNITY)
Admission: RE | Admit: 2018-03-27 | Discharge: 2018-03-30 | DRG: 787 | Disposition: A | Discharge status: Home / Self Care | Payer: BLUE CROSS/BLUE SHIELD | Attending: Obstetrics and Gynecology | Admitting: Obstetrics and Gynecology

## 2018-03-27 ENCOUNTER — Encounter (HOSPITAL_COMMUNITY)
Admission: RE | Disposition: A | Discharge status: Home / Self Care | Payer: Self-pay | Source: Home / Self Care | Attending: Obstetrics and Gynecology

## 2018-03-27 DIAGNOSIS — Z3A39 39 weeks gestation of pregnancy: Secondary | ICD-10-CM | POA: Diagnosis not present

## 2018-03-27 DIAGNOSIS — O2243 Hemorrhoids in pregnancy, third trimester: Secondary | ICD-10-CM | POA: Diagnosis present

## 2018-03-27 DIAGNOSIS — O9902 Anemia complicating childbirth: Secondary | ICD-10-CM | POA: Diagnosis present

## 2018-03-27 DIAGNOSIS — Z98891 History of uterine scar from previous surgery: Secondary | ICD-10-CM

## 2018-03-27 DIAGNOSIS — D649 Anemia, unspecified: Secondary | ICD-10-CM | POA: Diagnosis present

## 2018-03-27 DIAGNOSIS — O329XX1 Maternal care for malpresentation of fetus, unspecified, fetus 1: Secondary | ICD-10-CM

## 2018-03-27 DIAGNOSIS — O329XX0 Maternal care for malpresentation of fetus, unspecified, not applicable or unspecified: Secondary | ICD-10-CM

## 2018-03-27 DIAGNOSIS — O328XX0 Maternal care for other malpresentation of fetus, not applicable or unspecified: Secondary | ICD-10-CM | POA: Diagnosis present

## 2018-03-27 HISTORY — DX: Maternal care for malpresentation of fetus, unspecified, fetus 1: O32.9XX1

## 2018-03-27 HISTORY — DX: Maternal care for malpresentation of fetus, unspecified, not applicable or unspecified: O32.9XX0

## 2018-03-27 LAB — RPR: RPR: NONREACTIVE

## 2018-03-27 LAB — CBC
HEMATOCRIT: 26.9 % — AB (ref 36.0–46.0)
HEMOGLOBIN: 9.2 g/dL — AB (ref 12.0–15.0)
MCH: 20.4 pg — ABNORMAL LOW (ref 26.0–34.0)
MCHC: 34.2 g/dL (ref 30.0–36.0)
MCV: 59.8 fL — ABNORMAL LOW (ref 78.0–100.0)
Platelets: 287 10*3/uL (ref 150–400)
RBC: 4.5 MIL/uL (ref 3.87–5.11)
RDW: 15.9 % — ABNORMAL HIGH (ref 11.5–15.5)
WBC: 8.9 10*3/uL (ref 4.0–10.5)

## 2018-03-27 LAB — TYPE AND SCREEN
ABO/RH(D): O POS
ANTIBODY SCREEN: NEGATIVE

## 2018-03-27 SURGERY — Surgical Case
Anesthesia: Spinal | Site: Abdomen | Wound class: Clean Contaminated

## 2018-03-27 MED ORDER — TETANUS-DIPHTH-ACELL PERTUSSIS 5-2.5-18.5 LF-MCG/0.5 IM SUSP: 0.5000 mL | Freq: Once | INTRAMUSCULAR | Status: DC

## 2018-03-27 MED ORDER — PRENATAL MULTIVITAMIN CH
1.0000 | ORAL_TABLET | Freq: Every day | ORAL | Status: DC
  Administered 2018-03-29: 1 via ORAL
  Filled 2018-03-27: qty 1

## 2018-03-27 MED ORDER — LACTATED RINGERS IV SOLN: INTRAVENOUS | Status: DC

## 2018-03-27 MED ORDER — NALBUPHINE HCL 10 MG/ML IJ SOLN: 5.0000 mg | Freq: Once | INTRAMUSCULAR | Status: DC | PRN

## 2018-03-27 MED ORDER — SODIUM CHLORIDE 0.9% FLUSH: 3.0000 mL | INTRAVENOUS | Status: DC | PRN

## 2018-03-27 MED ORDER — NALBUPHINE HCL 10 MG/ML IJ SOLN: 5.0000 mg | INTRAMUSCULAR | Status: DC | PRN

## 2018-03-27 MED ORDER — ACETAMINOPHEN 160 MG/5ML PO SOLN: 325.0000 mg | ORAL | Status: DC | PRN

## 2018-03-27 MED ORDER — MENTHOL 3 MG MT LOZG: 1.0000 | LOZENGE | OROMUCOSAL | Status: DC | PRN

## 2018-03-27 MED ORDER — SIMETHICONE 80 MG PO CHEW: 80.0000 mg | CHEWABLE_TABLET | ORAL | Status: DC | PRN

## 2018-03-27 MED ORDER — ONDANSETRON HCL 4 MG/2ML IJ SOLN
INTRAMUSCULAR | Status: DC | PRN
  Administered 2018-03-27: 4 mg via INTRAVENOUS

## 2018-03-27 MED ORDER — SCOPOLAMINE 1 MG/3DAYS TD PT72
1.0000 | MEDICATED_PATCH | Freq: Once | TRANSDERMAL | Status: DC
  Administered 2018-03-27: 1.5 mg via TRANSDERMAL

## 2018-03-27 MED ORDER — LACTATED RINGERS IV SOLN
INTRAVENOUS | Status: DC
  Administered 2018-03-27 (×2): via INTRAVENOUS
  Administered 2018-03-27: 125 mL via INTRAVENOUS

## 2018-03-27 MED ORDER — SENNOSIDES-DOCUSATE SODIUM 8.6-50 MG PO TABS
2.0000 | ORAL_TABLET | ORAL | Status: DC
  Administered 2018-03-27 – 2018-03-29 (×3): 2 via ORAL
  Filled 2018-03-27 (×3): qty 2

## 2018-03-27 MED ORDER — KETOROLAC TROMETHAMINE 30 MG/ML IJ SOLN
INTRAMUSCULAR | Status: AC
  Filled 2018-03-27: qty 1

## 2018-03-27 MED ORDER — OXYCODONE HCL 5 MG PO TABS: 5.0000 mg | ORAL_TABLET | ORAL | Status: DC | PRN

## 2018-03-27 MED ORDER — FENTANYL CITRATE (PF) 100 MCG/2ML IJ SOLN
INTRAMUSCULAR | Status: DC | PRN
  Administered 2018-03-27: 15 ug via INTRATHECAL

## 2018-03-27 MED ORDER — PHENYLEPHRINE 8 MG IN D5W 100 ML (0.08MG/ML) PREMIX OPTIME
INJECTION | INTRAVENOUS | Status: DC | PRN
  Administered 2018-03-27: 60 ug/min via INTRAVENOUS

## 2018-03-27 MED ORDER — KETOROLAC TROMETHAMINE 30 MG/ML IJ SOLN
30.0000 mg | Freq: Four times a day (QID) | INTRAMUSCULAR | Status: AC | PRN
  Administered 2018-03-27: 30 mg via INTRAMUSCULAR

## 2018-03-27 MED ORDER — WITCH HAZEL-GLYCERIN EX PADS
1.0000 "application " | MEDICATED_PAD | CUTANEOUS | Status: DC | PRN
  Administered 2018-03-28: 1 via TOPICAL

## 2018-03-27 MED ORDER — KETOROLAC TROMETHAMINE 30 MG/ML IJ SOLN: 30.0000 mg | Freq: Four times a day (QID) | INTRAMUSCULAR | Status: AC | PRN

## 2018-03-27 MED ORDER — LACTATED RINGERS IV SOLN
INTRAVENOUS | Status: DC | PRN
  Administered 2018-03-27: 14:00:00 via INTRAVENOUS

## 2018-03-27 MED ORDER — SODIUM CHLORIDE 0.9 % IR SOLN
Status: DC | PRN
  Administered 2018-03-27: 1

## 2018-03-27 MED ORDER — SCOPOLAMINE 1 MG/3DAYS TD PT72
MEDICATED_PATCH | TRANSDERMAL | Status: AC
  Filled 2018-03-27: qty 1

## 2018-03-27 MED ORDER — MEPERIDINE HCL 25 MG/ML IJ SOLN: 6.2500 mg | INTRAMUSCULAR | Status: DC | PRN

## 2018-03-27 MED ORDER — ACETAMINOPHEN 325 MG PO TABS
650.0000 mg | ORAL_TABLET | ORAL | Status: DC | PRN
  Administered 2018-03-27 – 2018-03-30 (×5): 650 mg via ORAL
  Filled 2018-03-27 (×5): qty 2

## 2018-03-27 MED ORDER — DIBUCAINE 1 % RE OINT
1.0000 "application " | TOPICAL_OINTMENT | RECTAL | Status: DC | PRN
  Administered 2018-03-28 – 2018-03-29 (×3): 1 via RECTAL
  Filled 2018-03-27: qty 28

## 2018-03-27 MED ORDER — OXYTOCIN 10 UNIT/ML IJ SOLN
INTRAMUSCULAR | Status: AC
  Filled 2018-03-27: qty 4

## 2018-03-27 MED ORDER — NALOXONE HCL 0.4 MG/ML IJ SOLN: 0.4000 mg | INTRAMUSCULAR | Status: DC | PRN

## 2018-03-27 MED ORDER — SIMETHICONE 80 MG PO CHEW
80.0000 mg | CHEWABLE_TABLET | Freq: Three times a day (TID) | ORAL | Status: DC
  Administered 2018-03-27 – 2018-03-30 (×6): 80 mg via ORAL
  Filled 2018-03-27 (×7): qty 1

## 2018-03-27 MED ORDER — CEFAZOLIN SODIUM-DEXTROSE 2-4 GM/100ML-% IV SOLN
2.0000 g | INTRAVENOUS | Status: AC
  Administered 2018-03-27: 2 g via INTRAVENOUS

## 2018-03-27 MED ORDER — NALOXONE HCL 4 MG/10ML IJ SOLN
1.0000 ug/kg/h | INTRAVENOUS | Status: DC | PRN
  Filled 2018-03-27: qty 5

## 2018-03-27 MED ORDER — DIPHENHYDRAMINE HCL 25 MG PO CAPS: 25.0000 mg | ORAL_CAPSULE | Freq: Four times a day (QID) | ORAL | Status: DC | PRN

## 2018-03-27 MED ORDER — OXYCODONE HCL 5 MG/5ML PO SOLN: 5.0000 mg | Freq: Once | ORAL | Status: DC | PRN

## 2018-03-27 MED ORDER — COCONUT OIL OIL
1.0000 | TOPICAL_OIL | Status: DC | PRN
Start: 2018-03-27 — End: 2018-03-30

## 2018-03-27 MED ORDER — DIPHENHYDRAMINE HCL 50 MG/ML IJ SOLN: 12.5000 mg | INTRAMUSCULAR | Status: DC | PRN

## 2018-03-27 MED ORDER — SIMETHICONE 80 MG PO CHEW
80.0000 mg | CHEWABLE_TABLET | ORAL | Status: DC
  Administered 2018-03-27 – 2018-03-29 (×3): 80 mg via ORAL
  Filled 2018-03-27 (×3): qty 1

## 2018-03-27 MED ORDER — ONDANSETRON HCL 4 MG/2ML IJ SOLN
INTRAMUSCULAR | Status: AC
  Filled 2018-03-27: qty 2

## 2018-03-27 MED ORDER — MORPHINE SULFATE (PF) 0.5 MG/ML IJ SOLN
INTRAMUSCULAR | Status: AC
  Filled 2018-03-27: qty 10

## 2018-03-27 MED ORDER — ONDANSETRON HCL 4 MG/2ML IJ SOLN: 4.0000 mg | Freq: Once | INTRAMUSCULAR | Status: DC | PRN

## 2018-03-27 MED ORDER — DIPHENHYDRAMINE HCL 25 MG PO CAPS
25.0000 mg | ORAL_CAPSULE | ORAL | Status: DC | PRN
  Administered 2018-03-28 – 2018-03-29 (×2): 25 mg via ORAL
  Filled 2018-03-27 (×4): qty 1

## 2018-03-27 MED ORDER — SOD CITRATE-CITRIC ACID 500-334 MG/5ML PO SOLN
ORAL | Status: AC
  Filled 2018-03-27: qty 15

## 2018-03-27 MED ORDER — ONDANSETRON HCL 4 MG/2ML IJ SOLN: 4.0000 mg | Freq: Three times a day (TID) | INTRAMUSCULAR | Status: DC | PRN

## 2018-03-27 MED ORDER — MORPHINE SULFATE (PF) 0.5 MG/ML IJ SOLN
INTRAMUSCULAR | Status: DC | PRN
  Administered 2018-03-27: .15 mg via INTRATHECAL

## 2018-03-27 MED ORDER — FENTANYL CITRATE (PF) 100 MCG/2ML IJ SOLN
INTRAMUSCULAR | Status: AC
  Filled 2018-03-27: qty 2

## 2018-03-27 MED ORDER — BUPIVACAINE IN DEXTROSE 0.75-8.25 % IT SOLN
INTRATHECAL | Status: DC | PRN
  Administered 2018-03-27: 1.2 mg via INTRATHECAL

## 2018-03-27 MED ORDER — OXYTOCIN 10 UNIT/ML IJ SOLN
INTRAVENOUS | Status: DC | PRN
  Administered 2018-03-27: 40 [IU] via INTRAVENOUS

## 2018-03-27 MED ORDER — PHENYLEPHRINE 8 MG IN D5W 100 ML (0.08MG/ML) PREMIX OPTIME
INJECTION | INTRAVENOUS | Status: AC
Start: 2018-03-27 — End: ?
  Filled 2018-03-27: qty 100

## 2018-03-27 MED ORDER — IBUPROFEN 800 MG PO TABS
800.0000 mg | ORAL_TABLET | Freq: Three times a day (TID) | ORAL | Status: DC
  Administered 2018-03-27 – 2018-03-30 (×7): 800 mg via ORAL
  Filled 2018-03-27 (×7): qty 1

## 2018-03-27 MED ORDER — OXYCODONE HCL 5 MG PO TABS: 5.0000 mg | ORAL_TABLET | Freq: Once | ORAL | Status: DC | PRN

## 2018-03-27 MED ORDER — FENTANYL CITRATE (PF) 100 MCG/2ML IJ SOLN: 25.0000 ug | INTRAMUSCULAR | Status: DC | PRN

## 2018-03-27 MED ORDER — ZOLPIDEM TARTRATE 5 MG PO TABS: 5.0000 mg | ORAL_TABLET | Freq: Every evening | ORAL | Status: DC | PRN

## 2018-03-27 MED ORDER — OXYTOCIN 40 UNITS IN LACTATED RINGERS INFUSION - SIMPLE MED: 2.5000 [IU]/h | INTRAVENOUS | Status: AC

## 2018-03-27 MED ORDER — ACETAMINOPHEN 325 MG PO TABS: 325.0000 mg | ORAL_TABLET | ORAL | Status: DC | PRN

## 2018-03-27 MED ORDER — OXYCODONE HCL 5 MG PO TABS
10.0000 mg | ORAL_TABLET | ORAL | Status: DC | PRN
  Administered 2018-03-28 (×3): 10 mg via ORAL
  Filled 2018-03-27 (×3): qty 2

## 2018-03-27 SURGICAL SUPPLY — 35 items
BENZOIN TINCTURE PRP APPL 2/3 (GAUZE/BANDAGES/DRESSINGS) ×3 IMPLANT
CHLORAPREP W/TINT 26ML (MISCELLANEOUS) ×3 IMPLANT
CLAMP CORD UMBIL (MISCELLANEOUS) IMPLANT
CLOSURE STERI STRIP 1/2 X4 (GAUZE/BANDAGES/DRESSINGS) ×3 IMPLANT
CLOTH BEACON ORANGE TIMEOUT ST (SAFETY) ×3 IMPLANT
DRSG OPSITE POSTOP 4X10 (GAUZE/BANDAGES/DRESSINGS) ×3 IMPLANT
ELECT REM PT RETURN 9FT ADLT (ELECTROSURGICAL) ×3
ELECTRODE REM PT RTRN 9FT ADLT (ELECTROSURGICAL) ×1 IMPLANT
EXTRACTOR VACUUM M CUP 4 TUBE (SUCTIONS) IMPLANT
EXTRACTOR VACUUM M CUP 4' TUBE (SUCTIONS)
GLOVE BIO SURGEON STRL SZ 6.5 (GLOVE) ×2 IMPLANT
GLOVE BIO SURGEONS STRL SZ 6.5 (GLOVE) ×1
GLOVE BIOGEL PI IND STRL 7.0 (GLOVE) ×1 IMPLANT
GLOVE BIOGEL PI INDICATOR 7.0 (GLOVE) ×2
GOWN STRL REUS W/TWL LRG LVL3 (GOWN DISPOSABLE) ×6 IMPLANT
KIT ABG SYR 3ML LUER SLIP (SYRINGE) IMPLANT
NEEDLE HYPO 25X5/8 SAFETYGLIDE (NEEDLE) IMPLANT
NS IRRIG 1000ML POUR BTL (IV SOLUTION) ×3 IMPLANT
PACK C SECTION WH (CUSTOM PROCEDURE TRAY) ×3 IMPLANT
PAD OB MATERNITY 4.3X12.25 (PERSONAL CARE ITEMS) ×3 IMPLANT
PENCIL SMOKE EVAC W/HOLSTER (ELECTROSURGICAL) ×3 IMPLANT
RTRCTR C-SECT PINK 25CM LRG (MISCELLANEOUS) ×3 IMPLANT
STRIP CLOSURE SKIN 1/2X4 (GAUZE/BANDAGES/DRESSINGS) ×2 IMPLANT
SUT MNCRL 0 VIOLET CTX 36 (SUTURE) ×2 IMPLANT
SUT MONOCRYL 0 CTX 36 (SUTURE) ×4
SUT PLAIN 1 NONE 54 (SUTURE) IMPLANT
SUT PLAIN 2 0 XLH (SUTURE) ×3 IMPLANT
SUT VIC AB 0 CT1 27 (SUTURE) ×4
SUT VIC AB 0 CT1 27XBRD ANBCTR (SUTURE) ×2 IMPLANT
SUT VIC AB 2-0 CT1 27 (SUTURE) ×2
SUT VIC AB 2-0 CT1 TAPERPNT 27 (SUTURE) ×1 IMPLANT
SUT VIC AB 4-0 KS 27 (SUTURE) ×3 IMPLANT
SYR BULB IRRIGATION 50ML (SYRINGE) ×3 IMPLANT
TOWEL OR 17X24 6PK STRL BLUE (TOWEL DISPOSABLE) ×3 IMPLANT
TRAY FOLEY W/BAG SLVR 14FR LF (SET/KITS/TRAYS/PACK) ×3 IMPLANT

## 2018-03-27 NOTE — Brief Op Note (Signed)
03/27/2018  9:18 PM  PATIENT:  Brittany Wilson  32 y.o. female  PRE-OPERATIVE DIAGNOSIS:  Breech, failed version  POST-OPERATIVE DIAGNOSIS:  Breech, failed version, delivered  PROCEDURE:  Procedure(s): CESAREAN SECTION (N/A)  SURGEON:  Surgeon(s) and Role:    * Bovard-Stuckert, Cordel Drewes, MD - Primary  ANESTHESIA:   spinal  EBL:  746 mL IVF and uop per anesthesia, urine clear at end of procedure  FINDINGS: viable female infant at 13:28, apgars 8/9, 7#0.2; nl uterus, tubes and ovaries, double footling breech presentation  BLOOD ADMINISTERED:none  DRAINS: Urinary Catheter (Foley)   LOCAL MEDICATIONS USED:  NONE  SPECIMEN:  Source of Specimen:  Placenta  DISPOSITION OF SPECIMEN:  L&D  COUNTS:  YES  TOURNIQUET:  * No tourniquets in log *  DICTATION: .Other Dictation: Dictation Number [id number]  PLAN OF CARE: Admit to inpatient   PATIENT DISPOSITION:  PACU - hemodynamically stable.   Delay start of Pharmacological VTE agent (>24hrs) due to surgical blood loss or risk of bleeding: not applicable

## 2018-03-27 NOTE — Lactation Note (Signed)
This note was copied from a baby's chart. Lactation Consultation Note  Patient Name: Brittany Albin FellingRachana Wilson ZOXWR'UToday's Date: 03/27/2018 Reason for consult: Initial assessment;Term  8 hours old FT female who is being exclusively BF by his mother, she's a P3 and experienced BF. She BF her last child for 3-4 months and she already knows how to hand express. Colostrum poured very easily when she showed LC hand expression.   Offered assistance with latch but mom politely declined stating baby already fed, baby swaddled in visitor's arms (Godmother). Asked mom to call for latch assistance when needed. Per mom feedings at the breast are comfortable and she can hear baby swallowing. Both of her nipples looked intact with no signs of trauma.  Godmother requested formula, bottles and pacifiers for baby, explained to mom and visitors the consequences of introducing formula, pacifiers and bottles early in life, but since mom came as Breast/formula reviewed LEAD and let her know if she still wanted to give baby formula she can request it to her RN. Mom voiced understanding and said she wanted to pump and bottle instead of giving baby a bottle of formula.  Mom doesn't have a pump at home; set up a hand pump and provided her with breastmilk storage containers and slow flow nipples. Encouraged mom to still put baby to the breast 8-12 times/24 hours or sooner if feeding cues are present. Reviewed BF brochure, BF resources and feeding diary, parents are aware of LC services and will call PRN.  Maternal Data Formula Feeding for Exclusion: Yes Reason for exclusion: Mother's choice to formula and breast feed on admission Has patient been taught Hand Expression?: Yes Does the patient have breastfeeding experience prior to this delivery?: Yes  Feeding Feeding Type: Breast Fed Length of feed: 30 min  LATCH Score Latch: Grasps breast easily, tongue down, lips flanged, rhythmical sucking.  Audible Swallowing: Spontaneous and  intermittent  Type of Nipple: Everted at rest and after stimulation  Comfort (Breast/Nipple): Soft / non-tender  Hold (Positioning): No assistance needed to correctly position infant at breast.  LATCH Score: 10  Interventions Interventions: Breast feeding basics reviewed;Hand express;Breast compression;Breast massage;Hand pump  Lactation Tools Discussed/Used Tools: Pump Breast pump type: Manual WIC Program: No Pump Review: Setup, frequency, and cleaning;Milk Storage Initiated by:: MPeck Date initiated:: 03/27/18   Consult Status Consult Status: Follow-up Date: 03/28/18 Follow-up type: In-patient    Brittany Wilson Brittany Wilson 03/27/2018, 9:31 PM

## 2018-03-27 NOTE — Anesthesia Postprocedure Evaluation (Signed)
Anesthesia Post Note  Patient: Brittany Wilson  Procedure(s) Performed: CESAREAN SECTION (N/A Abdomen)     Patient location during evaluation: PACU Anesthesia Type: Spinal Level of consciousness: oriented and awake and alert Pain management: pain level controlled Vital Signs Assessment: post-procedure vital signs reviewed and stable Respiratory status: spontaneous breathing, respiratory function stable and patient connected to nasal cannula oxygen Cardiovascular status: blood pressure returned to baseline and stable Postop Assessment: no headache, no backache and no apparent nausea or vomiting Anesthetic complications: no    Last Vitals:  Vitals:   03/27/18 1754 03/27/18 1853  BP: 101/73 (!) 99/58  Pulse: 72 72  Resp: 19 18  Temp: 37.1 C   SpO2: 100% 100%    Last Pain:  Vitals:   03/27/18 1901  TempSrc:   PainSc: 3                  Ignacio Lowder

## 2018-03-27 NOTE — Anesthesia Preprocedure Evaluation (Signed)
Anesthesia Evaluation  Patient identified by MRN, date of birth, ID band Patient awake    Reviewed: Allergy & Precautions, H&P , NPO status , Patient's Chart, lab work & pertinent test results, reviewed documented beta blocker date and time   Airway Mallampati: II  TM Distance: >3 FB Neck ROM: full    Dental no notable dental hx.    Pulmonary neg pulmonary ROS,    Pulmonary exam normal breath sounds clear to auscultation       Cardiovascular negative cardio ROS Normal cardiovascular exam Rhythm:regular Rate:Normal     Neuro/Psych negative neurological ROS  negative psych ROS   GI/Hepatic negative GI ROS, Neg liver ROS,   Endo/Other  negative endocrine ROS  Renal/GU negative Renal ROS  negative genitourinary   Musculoskeletal   Abdominal   Peds  Hematology negative hematology ROS (+)   Anesthesia Other Findings   Reproductive/Obstetrics (+) Pregnancy                             Anesthesia Physical Anesthesia Plan  ASA: II  Anesthesia Plan: Spinal   Post-op Pain Management:    Induction:   PONV Risk Score and Plan: Scopolamine patch - Pre-op, Ondansetron, Dexamethasone and Treatment may vary due to age or medical condition  Airway Management Planned: Nasal Cannula and Natural Airway  Additional Equipment:   Intra-op Plan:   Post-operative Plan:   Informed Consent: I have reviewed the patients History and Physical, chart, labs and discussed the procedure including the risks, benefits and alternatives for the proposed anesthesia with the patient or authorized representative who has indicated his/her understanding and acceptance.     Plan Discussed with: CRNA, Anesthesiologist and Surgeon  Anesthesia Plan Comments: (  )        Anesthesia Quick Evaluation

## 2018-03-27 NOTE — Transfer of Care (Signed)
Immediate Anesthesia Transfer of Care Note  Patient: Brittany Wilson  Procedure(s) Performed: CESAREAN SECTION (N/A Abdomen)  Patient Location: PACU  Anesthesia Type:Spinal  Level of Consciousness: awake and patient cooperative  Airway & Oxygen Therapy: Patient Spontanous Breathing  Post-op Assessment: Report given to RN and Post -op Vital signs reviewed and stable  Post vital signs: Reviewed and stable  Last Vitals:  Vitals Value Taken Time  BP    Temp    Pulse 93 03/27/2018  2:14 PM  Resp    SpO2 100 % 03/27/2018  2:14 PM  Vitals shown include unvalidated device data.  Last Pain:  Vitals:   03/27/18 1231  TempSrc: Oral  PainSc: 0-No pain         Complications: No apparent anesthesia complications

## 2018-03-27 NOTE — Anesthesia Procedure Notes (Signed)
Spinal  Patient location during procedure: OR Start time: 03/27/2018 1:06 PM End time: 03/27/2018 1:00 PM Staffing Anesthesiologist: Bethena Midgetddono, Franco Duley, MD Preanesthetic Checklist Completed: patient identified, site marked, surgical consent, pre-op evaluation, timeout performed, IV checked, risks and benefits discussed and monitors and equipment checked Spinal Block Patient position: sitting Prep: DuraPrep Patient monitoring: heart rate, cardiac monitor, continuous pulse ox and blood pressure Approach: midline Location: L3-4 Injection technique: single-shot Needle Needle type: Sprotte  Needle gauge: 24 G Needle length: 9 cm Assessment Sensory level: T4

## 2018-03-27 NOTE — Progress Notes (Signed)
Patient ID: Brittany GriffinsRachana T Kadlec, female   DOB: 12/30/1985, 32 y.o.   MRN: [id number]   Again d/w pt r/b/a iof LTCS, wish to proceed.  Declines BTL.  Will proceed ASAP

## 2018-03-27 NOTE — Progress Notes (Signed)
Patient ID: Brittany Wilson, female   DOB: 08/06/1986, 32 y.o.   MRN: [id number]   D/w pt r/b/a of ECV  FHTs reactive Several attempts, head did not move from maternal RUQ  D/w pt r/b/a of LTCS - will proceed around 12:30  Pt may desire BTL by salpingectomy - d/w her r/b/a  Will recheck and then proceed this pm

## 2018-03-28 DIAGNOSIS — D649 Anemia, unspecified: Secondary | ICD-10-CM | POA: Diagnosis not present

## 2018-03-28 LAB — BIRTH TISSUE RECOVERY COLLECTION (PLACENTA DONATION)

## 2018-03-28 LAB — CBC
HEMATOCRIT: 20.5 % — AB (ref 36.0–46.0)
HEMOGLOBIN: 7.2 g/dL — AB (ref 12.0–15.0)
MCH: 20.9 pg — AB (ref 26.0–34.0)
MCHC: 35.1 g/dL (ref 30.0–36.0)
MCV: 59.6 fL — ABNORMAL LOW (ref 78.0–100.0)
Platelets: 221 10*3/uL (ref 150–400)
RBC: 3.44 MIL/uL — ABNORMAL LOW (ref 3.87–5.11)
RDW: 16 % — ABNORMAL HIGH (ref 11.5–15.5)
WBC: 10.2 10*3/uL (ref 4.0–10.5)

## 2018-03-28 MED ORDER — PANTOPRAZOLE SODIUM 40 MG PO TBEC
40.0000 mg | DELAYED_RELEASE_TABLET | Freq: Every day | ORAL | Status: DC
  Administered 2018-03-28 – 2018-03-30 (×3): 40 mg via ORAL
  Filled 2018-03-28 (×3): qty 1

## 2018-03-28 MED ORDER — LIDOCAINE-EPINEPHRINE 2 %-1:100000 IJ SOLN
20.0000 mL | Freq: Once | INTRAMUSCULAR | Status: DC
  Filled 2018-03-28: qty 20

## 2018-03-28 MED ORDER — HYDROMORPHONE HCL 2 MG PO TABS
2.0000 mg | ORAL_TABLET | ORAL | Status: DC | PRN
  Administered 2018-03-28 – 2018-03-30 (×9): 2 mg via ORAL
  Filled 2018-03-28 (×9): qty 1

## 2018-03-28 MED ORDER — POLYETHYLENE GLYCOL 3350 17 G PO PACK
17.0000 g | PACK | Freq: Every day | ORAL | Status: DC
  Administered 2018-03-29 – 2018-03-30 (×2): 17 g via ORAL
  Filled 2018-03-28 (×3): qty 1

## 2018-03-28 MED ORDER — HYDROCORTISONE ACE-PRAMOXINE 2.5-1 % RE CREA
TOPICAL_CREAM | Freq: Four times a day (QID) | RECTAL | Status: DC
  Administered 2018-03-28 – 2018-03-30 (×5): via RECTAL
  Filled 2018-03-28: qty 30

## 2018-03-28 MED ORDER — MISOPROSTOL 200 MCG PO TABS
800.0000 ug | ORAL_TABLET | Freq: Once | ORAL | Status: DC
  Filled 2018-03-28: qty 4

## 2018-03-28 NOTE — Progress Notes (Signed)
Patient ID: [name], female   DOB: 07/05/1986, 32 y.o.   MRN: [id number]   CTSP with large, firm hemorrhoid and heavier bleeding.  Called earlier in the am with heavier VB, had ordered cytotec (800mcg PR) Nurse called with large firm hemorrhoid - unable to place cytotec  On exam Pt asleep on arrival, awakened w/o diff Abd soft, FFNT, below umb.  Inc bandage C/D/I Pt with large 4-5cm swollen thrombosed hemorrhoid w tucks pads, some edema  Pt states not that tender, In am will call [facility] for I&D.

## 2018-03-28 NOTE — Progress Notes (Signed)
Patient complaining of itching and upon assessment noted raised reddened areas all over body sporadically located. Patient took benadryl in which it helped the itching; however, whelps seemed to get worse. Notified Dr. Senaida Oresichardson who changed pain medication orders and added protonix. See orders. Earl Galasborne, Linda HedgesStefanie FriendshipHudspeth

## 2018-03-28 NOTE — Progress Notes (Signed)
Subjective: Postpartum Day 1: Cesarean Delivery Patient reports tolerating PO.  Was a bit lightheaded this AM when sitting on side of bed, so foley still in.  Has not tried ambulating again. Hemorrhoid more uncomfortable because duramorph wearing off  Objective: Vital signs in last 24 hours: Temp:  [97.8 F (36.6 C)-98.7 F (37.1 C)] 98.6 F (37 C) (08/15 0229) Pulse Rate:  [65-93] 78 (08/15 0229) Resp:  [13-24] 18 (08/15 0229) BP: (85-110)/(56-80) 85/56 (08/15 0229) SpO2:  [96 %-100 %] 96 % (08/15 0500) Weight:  [60.3 kg] 60.3 kg (08/14 1124)  Physical Exam:  General: alert and cooperative Lochia: appropriate Uterine Fundus: firm Incision: C/D/I with pressure dressing Large thrombosed hemorrhoid about 5cm x 2cm  UOP 1250/shift  Recent Labs    03/27/18 0812 03/28/18 0511  HGB 9.2* 7.2*  HCT 26.9* 20.5*    Assessment/Plan: Relative anemia, but pretty normal drop in hemoglobin Hemorrhoid painful and about 5cm, called Dr. Marlyne BeardsJennings of General Surgery and asked if someone can come look and see if needs lancing Will leave foley in until sure can ambulate without problem Baby nursing well, declines circumcision  Oliver PilaKathy W Wanetta Funderburke 03/28/2018, 9:07 AM

## 2018-03-28 NOTE — Consult Note (Signed)
Reason for Consult: From post hemorrhoid Referring Physician: Huel Coteichardson, Kathy, MD  [name] Brittany Wilson is an 32 y.o. female.  HPI: Patient is a 32 year old female who is postop day 1 after C-section.  She was found to have a thrombosed hemorrhoid that is about 5 x 2 cm.  We are asked to see.  White count 10.2 hemoglobin 7.2, hematocrit 20.5, platelets 221,000. She is afebrile blood pressure is down somewhat this morning 87/58.  Vital signs are stable.Problem did not occur until this delivery  Past Medical History:  Diagnosis Date  . Failed external cephalic version 1/61/09608/14/2019  . Malpresentation before onset of labor, fetus 1 03/27/2018  . Medical history non-contributory     Past Surgical History:  Procedure Laterality Date  . CESAREAN SECTION N/A 03/27/2018   Procedure: CESAREAN SECTION;  Surgeon: Sherian ReinBovard-Stuckert, Jody, MD;  Location: [facility];  Service: Obstetrics;  Laterality: N/A;    Family History  Problem Relation Age of Onset  . Liver cancer Maternal Grandfather     Social History:  reports that she has never smoked. She has never used smokeless tobacco. She reports that she does not drink alcohol or use drugs.  Allergies:  Allergies  Allergen Reactions  . Shellfish Allergy     Medications:  Prior to Admission:  No medications prior to admission.   Scheduled: . ibuprofen  800 mg Oral Q8H  . misoprostol  800 mcg Rectal Once  . prenatal multivitamin  1 tablet Oral Q1200  . scopolamine  1 patch Transdermal Once  . senna-docusate  2 tablet Oral Q24H  . simethicone  80 mg Oral TID PC  . simethicone  80 mg Oral Q24H  . Tdap  0.5 mL Intramuscular Once   Continuous: . lactated ringers 125 mL/hr at 03/27/18 2246  . lactated ringers    . naLOXone [facility]) adult infusion for PRURITIS     Anti-infectives (From admission, onward)   Start     Dose/Rate Route Frequency Ordered Stop   03/27/18 1300  ceFAZolin (ANCEF) IVPB 2g/100 mL premix     2 g 200 mL/hr over 30  Minutes Intravenous On call to O.R. 03/27/18 1242 03/27/18 1308      Results for orders placed or performed during the hospital encounter of 03/27/18 (from the past 48 hour(s))  Type and screen [facility]     Status: None   Collection Time: 03/27/18  8:12 AM  Result Value Ref Range   ABO/RH(D) O POS    Antibody Screen NEG    Sample Expiration      03/30/2018 Performed at [facility], [address] [place], [place] [address]   CBC     Status: Abnormal   Collection Time: 03/27/18  8:12 AM  Result Value Ref Range   WBC 8.9 4.0 - 10.5 K/uL   RBC 4.50 3.87 - 5.11 MIL/uL   Hemoglobin 9.2 (L) 12.0 - 15.0 g/dL   HCT 81.126.9 (L) 91.436.0 - 78.246.0 %   MCV 59.8 (L) 78.0 - 100.0 fL   MCH 20.4 (L) 26.0 - 34.0 pg   MCHC 34.2 30.0 - 36.0 g/dL   RDW 95.615.9 (H) 21.311.5 - 08.615.5 %   Platelets 287 150 - 400 K/uL    Comment: Performed at [facility], [address] [place], [place] [address]  RPR     Status: None   Collection Time: 03/27/18  8:12 AM  Result Value Ref Range   RPR Ser Ql Non Reactive Non Reactive  Comment: (NOTE) Performed At: [facility] [address] [place], [place] [id number] Jolene SchimkeNagendra Sanjai MD NW:[id number]:[phone]   CBC     Status: Abnormal   Collection Time: 03/28/18  5:11 AM  Result Value Ref Range   WBC 10.2 4.0 - 10.5 K/uL   RBC 3.44 (L) 3.87 - 5.11 MIL/uL   Hemoglobin 7.2 (L) 12.0 - 15.0 g/dL    Comment: DELTA CHECK NOTED REPEATED TO VERIFY    HCT 20.5 (L) 36.0 - 46.0 %   MCV 59.6 (L) 78.0 - 100.0 fL   MCH 20.9 (L) 26.0 - 34.0 pg   MCHC 35.1 30.0 - 36.0 g/dL   RDW 57.816.0 (H) 46.911.5 - 62.915.5 %   Platelets 221 150 - 400 K/uL    Comment: Performed at [facility], [address] [place], [place] [address]  Collect bld for placenta donatation     Status: None   Collection Time: 03/28/18  5:11 AM  Result Value Ref Range   Placenta donation bld collect COLLECTED BY LABORATORY     Comment: Performed at [facility], [address]  [place], [place] [address]    No results found.  Review of Systems  All other systems reviewed and are negative.  Blood pressure (!) 87/58, pulse 68, temperature 98.4 F (36.9 C), temperature source Oral, resp. rate 16, height 5' (1.524 m), weight 60.3 kg, last menstrual period 07/10/2017, SpO2 96 %, unknown if currently breastfeeding. Physical Exam  Constitutional: She is oriented to person, place, and time. She appears well-developed and well-nourished. No distress.  HENT:  Head: Normocephalic and atraumatic.  Mouth/Throat: Oropharynx is clear and moist. No oropharyngeal exudate.  Eyes: Right eye exhibits no discharge. Left eye exhibits no discharge. No scleral icterus.  Pupils are equal  Neck: Normal range of motion. Neck supple. No JVD present. No tracheal deviation present. No thyromegaly present.  Cardiovascular: Normal rate, regular rhythm, normal heart sounds and intact distal pulses.  No murmur heard. Respiratory: Effort normal and breath sounds normal. No respiratory distress. She has no wheezes. She has no rales. She exhibits no tenderness.  GI: Soft. Bowel sounds are normal. She exhibits no distension.  C section yesterday with the dressing intact  Genitourinary:  Genitourinary Comments: Large 5 cm hemorrhoid grade III  Internal hemorrhoid, it is not thrombosed.  It is ulcerated.  Musculoskeletal: She exhibits no edema or tenderness.  Lymphadenopathy:    She has no cervical adenopathy.  Neurological: She is alert and oriented to person, place, and time. No cranial nerve deficit.  Skin: Skin is warm and dry. Rash noted. She is not diaphoretic.  She has some non erythematous whelps, that are getting larger.  Sites are on her thigh and legs.  Psychiatric: She has a normal mood and affect. Her behavior is normal. Judgment and thought content normal.      Assessment/Plan: Post partum C section day 1 Grade 3 Internal Hemorrhoid circumferential   Plan:   Sitz bath, ice packs,  Metamucil, Analpram 2.5% QID.  This is not thrombosed, but a circumferential grade III internal hemorrhoid.  She can use the above treatment for now and call our office for follow up with one of our colorectal surgeons  after discharge if the problem persist.  Sheza Strickland 03/28/2018, 12:29 PM

## 2018-03-28 NOTE — Addendum Note (Signed)
Addendum  created 03/28/18 0752 by Earmon PhoenixWilkerson, Lakshya Mcgillicuddy P, CRNA   Sign clinical note

## 2018-03-28 NOTE — Discharge Instructions (Signed)
Disposable Sitz Bath A disposable sitz bath is a plastic basin that fits over the toilet. A bag is hung above the toilet, and the bag is connected to a tube that opens into the basin. The bag is filled with warm water that flows into the basin through the tube. A sitz bath can be used to help relieve symptoms, clean, and promote healing in the genital and anal areas, as well as in the lower abdomen and buttocks. What are the risks? Sitz baths are generally very safe. It is possible for the skin between the genitals and the anus (perineum) to become infected, but this is rare. You can avoid this by cleaning your sitz bath supplies thoroughly. How to use a disposable sitz bath 1. Close the clamp on the tube. Make sure the clamp is closed tightly to prevent leakage. 2. Fill the sitz bath basin and the plastic bag with warm water. The water should be warm enough to be comfortable, but not hot. 3. Raise the toilet seat and place the filled basin on the toilet. Make sure the overflow opening is facing toward the back of the toilet. ? If you prefer, you may place the empty basin on the toilet first, and then use the plastic bag to fill the basin with warm water. 4. Hang the filled plastic bag overhead on a hook or towel rack close to the toilet. The bag should be higher than the toilet so that the water will flow down through the tube. 5. Attach the tube to the opening on the basin. Make sure that the tube is attached to the basin tightly to prevent leakage. 6. Sit on the basin and release the clamp. This will allow warm water to flow into the basin and flush the area around your genitals and anus. 7. Remain sitting on the basin for about 15-20 minutes, or as long as told by your health care provider. 8. Stand up and gently pat your skin dry. If directed, apply clean bandages (dressings) to the affected area as told by your health care provider. 9. Carefully remove the basin from the toilet seat and tip the  basin into the toilet to empty any remaining water. Empty any remaining water from the plastic bag into the toilet. Then, flush the toilet. 10. Wash the basin with warm water and soap. Let the basin air dry in the sink. You should also let the plastic bag and the tubing air dry. 11. Store the basin, tubing, and plastic bag in a clean, dry area. 12. Wash your hands with soap and water. If soap and water are not available, use hand sanitizer. Contact a health care provider if:  You have symptoms that get worse instead of better.  You develop new skin irritation, redness, or swelling around your genitals or anus. This information is not intended to replace advice given to you by your health care provider. Make sure you discuss any questions you have with your health care provider. Document Released: 01/30/2012 Document Revised: 01/06/2016 Document Reviewed: 06/20/2015 Elsevier Interactive Patient Education  2018 [employer]   Hemorrhoids Hemorrhoids are swollen veins in and around the rectum or anus. There are two types of hemorrhoids:  Internal hemorrhoids. These occur in the veins that are just inside the rectum. They may poke through to the outside and become irritated and painful.  External hemorrhoids. These occur in the veins that are outside of the anus and can be felt as a painful swelling or hard lump  near the anus.  Most hemorrhoids do not cause serious problems, and they can be managed with home treatments such as diet and lifestyle changes. If home treatments do not help your symptoms, procedures can be done to shrink or remove the hemorrhoids. What are the causes? This condition is caused by increased pressure in the anal area. This pressure may result from various things, including:  Constipation.  Straining to have a bowel movement.  Diarrhea.  Pregnancy.  Obesity.  Sitting for long periods of time.  Heavy lifting or other activity that causes you to  strain.  Anal sex.  What are the signs or symptoms? Symptoms of this condition include:  Pain.  Anal itching or irritation.  Rectal bleeding.  Leakage of stool (feces).  Anal swelling.  One or more lumps around the anus.  How is this diagnosed? This condition can often be diagnosed through a visual exam. Other exams or tests may also be done, such as:  Examination of the rectal area with a gloved hand (digital rectal exam).  Examination of the anal canal using a small tube (anoscope).  A blood test, if you have lost a significant amount of blood.  A test to look inside the colon (sigmoidoscopy or colonoscopy).  How is this treated? This condition can usually be treated at home. However, various procedures may be done if dietary changes, lifestyle changes, and other home treatments do not help your symptoms. These procedures can help make the hemorrhoids smaller or remove them completely. Some of these procedures involve surgery, and others do not. Common procedures include:  Rubber band ligation. Rubber bands are placed at the base of the hemorrhoids to cut off the blood supply to them.  Sclerotherapy. Medicine is injected into the hemorrhoids to shrink them.  Infrared coagulation. A type of light energy is used to get rid of the hemorrhoids.  Hemorrhoidectomy surgery. The hemorrhoids are surgically removed, and the veins that supply them are tied off.  Stapled hemorrhoidopexy surgery. A circular stapling device is used to remove the hemorrhoids and use staples to cut off the blood supply to them.  Follow these instructions at home: Eating and drinking  Eat foods that have a lot of fiber in them, such as whole grains, beans, nuts, fruits, and vegetables. Ask your health care provider about taking products that have added fiber (fiber supplements).  Drink enough fluid to keep your urine clear or pale yellow. Managing pain and swelling  Take warm sitz baths for 20  minutes, 3-4 times a day to ease pain and discomfort.  If directed, apply ice to the affected area. Using ice packs between sitz baths may be helpful. ? Put ice in a plastic bag. ? Place a towel between your skin and the bag. ? Leave the ice on for 20 minutes, 2-3 times a day. General instructions  Take over-the-counter and prescription medicines only as told by your health care provider.  Use medicated creams or suppositories as told.  Exercise regularly.  Go to the bathroom when you have the urge to have a bowel movement. Do not wait.  Avoid straining to have bowel movements.  Keep the anal area dry and clean. Use wet toilet paper or moist towelettes after a bowel movement.  Do not sit on the toilet for long periods of time. This increases blood pooling and pain. Contact a health care provider if:  You have increasing pain and swelling that are not controlled by treatment or medicine.  You have  uncontrolled bleeding.  You have difficulty having a bowel movement, or you are unable to have a bowel movement.  You have pain or inflammation outside the area of the hemorrhoids. This information is not intended to replace advice given to you by your health care provider. Make sure you discuss any questions you have with your health care provider. Document Released: 07/28/2000 Document Revised: 12/29/2015 Document Reviewed: 04/14/2015 Elsevier Interactive Patient Education  2018 [employer].  GETTING TO GOOD BOWEL HEALTH. Irregular bowel habits such as constipation and diarrhea can lead to many problems over time.  Having one soft bowel movement a day is the most important way to prevent further problems.  The anorectal canal is designed to handle stretching and feces to safely manage our ability to get rid of solid waste (feces, poop, stool) out of our body.  BUT, hard constipated stools can act like ripping concrete bricks and diarrhea can be a burning fire to this very sensitive  area of our body, causing inflamed hemorrhoids, anal fissures, increasing risk is perirectal abscesses, abdominal pain/bloating, an making irritable bowel worse.     The goal: ONE SOFT BOWEL MOVEMENT A DAY!  To have soft, regular bowel movements:   Drink at least 8 tall glasses of water a day.    Take plenty of fiber.  Fiber is the undigested part of plant food that passes into the colon, acting s natures broom to encourage bowel motility and movement.  Fiber can absorb and hold large amounts of water. This results in a larger, bulkier stool, which is soft and easier to pass. Work gradually over several weeks up to 6 servings a day of fiber (25g a day even more if needed) in the form of: o Vegetables -- Root (potatoes, carrots, turnips), leafy green (lettuce, salad greens, celery, spinach), or cooked high residue (cabbage, broccoli, etc) o Fruit -- Fresh (unpeeled skin & pulp), Dried (prunes, apricots, cherries, etc ),  or stewed ( applesauce)  o Whole grain breads, pasta, etc (whole wheat)  o Bran cereals   Bulking Agents -- This type of water-retaining fiber generally is easily obtained each day by one of the following:  o Psyllium bran -- The psyllium plant is remarkable because its ground seeds can retain so much water. This product is available as Metamucil, Konsyl, Effersyllium, Per Diem Fiber, or the less expensive generic preparation in drug and health food stores. Although labeled a laxative, it really is not a laxative.  o Methylcellulose -- This is another fiber derived from wood which also retains water. It is available as Citrucel. o Polyethylene Glycol - and artificial fiber commonly called Miralax or Glycolax.  It is helpful for people with gassy or bloated feelings with regular fiber o Flax Seed - a less gassy fiber than psyllium  No reading or other relaxing activity while on the toilet. If bowel movements take longer than 5 minutes, you are too constipated  AVOID  CONSTIPATION.  High fiber and water intake usually takes care of this.  Sometimes a laxative is needed to stimulate more frequent bowel movements, but   Laxatives are not a good long-term solution as it can wear the colon out. o Osmotics (Milk of Magnesia, Fleets phosphosoda, Magnesium citrate, MiraLax, GoLytely) are safer than  o Stimulants (Senokot, Castor Oil, Dulcolax, Ex Lax)    o Do not take laxatives for more than 7days in a row.   IF SEVERELY CONSTIPATED, try a Bowel Retraining Program: o Do not use laxatives.  o Eat a diet high in roughage, such as bran cereals and leafy vegetables.  o Drink six (6) ounces of prune or apricot juice each morning.  o Eat two (2) large servings of stewed fruit each day.  o Take one (1) heaping tablespoon of a psyllium-based bulking agent twice a day. Use sugar-free sweetener when possible to avoid excessive calories.  o Eat a normal breakfast.  o Set aside 15 minutes after breakfast to sit on the toilet, but do not strain to have a bowel movement.  o If you do not have a bowel movement by the third day, use an enema and repeat the above steps.   Controlling diarrhea o Switch to liquids and simpler foods for a few days to avoid stressing your intestines further. o Avoid dairy products (especially milk & ice cream) for a short time.  The intestines often can lose the ability to digest lactose when stressed. o Avoid foods that cause gassiness or bloating.  Typical foods include beans and other legumes, cabbage, broccoli, and dairy foods.  Every person has some sensitivity to other foods, so listen to our body and avoid those foods that trigger problems for you. o Adding fiber (Citrucel, Metamucil, psyllium, Miralax) gradually can help thicken stools by absorbing excess fluid and retrain the intestines to act more normally.  Slowly increase the dose over a few weeks.  Too much fiber too soon can backfire and cause cramping & bloating. o Probiotics (such as  active yogurt, Align, etc) may help repopulate the intestines and colon with normal bacteria and calm down a sensitive digestive tract.  Most studies show it to be of mild help, though, and such products can be costly. o Medicines: - Bismuth subsalicylate (ex. Kayopectate, Pepto Bismol) every 30 minutes for up to 6 doses can help control diarrhea.  Avoid if pregnant. - Loperamide (Immodium) can slow down diarrhea.  Start with two tablets (4mg  total) first and then try one tablet every 6 hours.  Avoid if you are having fevers or severe pain.  If you are not better or start feeling worse, stop all medicines and call your doctor for advice o Call your doctor if you are getting worse or not better.  Sometimes further testing (cultures, endoscopy, X-ray studies, bloodwork, etc) may be needed to help diagnose and treat the cause of the diarrhea.

## 2018-03-28 NOTE — Anesthesia Postprocedure Evaluation (Signed)
Anesthesia Post Note  Patient: Brittany Wilson  Procedure(s) Performed: CESAREAN SECTION (N/A Abdomen)     Patient location during evaluation: Mother Baby Anesthesia Type: Spinal Level of consciousness: awake Pain management: pain level controlled Vital Signs Assessment: post-procedure vital signs reviewed and stable Respiratory status: spontaneous breathing Cardiovascular status: stable Postop Assessment: patient able to bend at knees and spinal receding Anesthetic complications: no    Last Vitals:  Vitals:   03/28/18 0229 03/28/18 0500  BP: (!) 85/56   Pulse: 78   Resp: 18   Temp: 37 C   SpO2: 96% 96%    Last Pain:  Vitals:   03/28/18 0345  TempSrc:   PainSc: 2    Pain Goal: Patients Stated Pain Goal: 3 (03/28/18 0315)               Edison PaceWILKERSON,Iraida Cragin

## 2018-03-28 NOTE — Progress Notes (Signed)
Nurse in room for check. Nurse noted moderate amounts of clots.  Nurse asked L&D nurse Dianna to verify finding.  Dr. Ellyn HackBovard notified of findings and orders received.

## 2018-03-28 NOTE — Op Note (Signed)
NAMMarland Kitchen: Brittany GriffinsTH, Brittany T. MEDICAL RECORD [id number] ACCOUNT [id number] DATE OF BIRTH:[birth year] FACILITY: [facility] LOCATION: [id number] PHYSICIAN:Retina Bernardy BOVARD-STUCKERT, MD  OPERATIVE REPORT  DATE OF PROCEDURE:  03/27/2018  PREOPERATIVE DIAGNOSES:  Breech presentation, failed version, intrauterine pregnancy at 39+ weeks.  POSTOPERATIVE DIAGNOSES:  Breech presentation, failed version, intrauterine pregnancy at 39+ weeks, delivered.  PROCEDURE:  Primary low transverse cesarean section.  SURGEON:  Sherian ReinJody Bovard-Stuckert, MD  COMPLICATIONS:  None.  PATHOLOGY:  Placenta to labor and delivery.  ESTIMATED BLOOD LOSS:  Approximately 746 mL  URINE OUTPUT AND IV FLUIDS:  Per anesthesia.  URINE OUTPUT:  Clear at the end of the procedure per anesthesia.  FINDINGS:  Viable female infant at [id number] with Apgars of 8 at 1 minute and 9 at 5 minutes and a weight of 7 pounds 0.2 ounces.  Normal uterus, tubes and ovaries are noted.  Baby delivered from a footling breech presentation.  DESCRIPTION OF PROCEDURE:  After informed consent was reviewed with the patient including risks, benefits and alternatives of the surgical procedure given the failed version, she was transported to the operating room where spinal anesthesia was placed  and found to be adequate.  She was then returned to the supine position with a leftward tilt, prepped and draped in the normal sterile fashion.  A Pfannenstiel skin incision was made after the spinal was adequate.  Incision was made approximately 2  fingerbreadths above the pubic symphysis, carried through to the underlying layer of fascia sharply.  The fascia was incised in the midline.  This incision was extended laterally with Mayo scissors.  Superior aspect of the fascial incision was grasped  with Kocher clamps, elevated and the rectus muscles were dissected off both bluntly and sharply.  Midline was easily identified.  The peritoneum was entered bluntly.  The incision was extended  superiorly and inferiorly with good visualization of the  bladder.  An Alexis skin retractor was placed carefully making sure that that no bowel was ____.  The uterus was explored and then placed in a transverse fashion in the lower uterine segment.  Infant was delivered from a double footling breech  presentation.  The feet were grasped and removed from the uterus.  The infant was pulled to the level of the buttocks.  The hips were grasped and the infant was delivered to the point of the shoulders.  The arms were reduced bilaterally and the head was  delivered after a slight delay.  The head delivered to the level of the nose.  The baby was crying.  The uppermost portion of the head took a moment to deliver.  Apgars were 8 and 9.  The placenta was expressed.  The uterus was cleared of all clot and  debris.  Uterine incision was closed in two layers, first of which is running 0 locked and the second as an imbricating layer with 0 Monocryl.  The gutters were cleared of all clot and debris.  Normal anatomy was assured.  The peritoneum was  reapproximated with 2-0 Vicryl in a running fashion.  The subfascial planes were inspected and found to be hemostatic.  The fascia was reapproximated with 0 Vicryl in a single suture.  The subcuticular adipose layer was made hemostatic with Bovie cautery  and the dead space was closed with plain gut suture in a running fashion.  The skin was closed with 4-0 Vicryl in a subcuticular fashion.  Benzoin and Steri-Strips were applied.  The sponge, lap and needle count was correct x2 per the  operating staff  and the patient was transported in stable condition to the PACU.  TN/NUANCE  D:03/27/2018 T:03/28/2018 JOB:[id number]

## 2018-03-29 NOTE — Lactation Note (Signed)
This note was copied from a baby's chart. Lactation Consultation Note  Patient Name: Brittany Wilson GUYQI'HToday's Date: 03/29/2018 Reason for consult: Follow-up assessment;Term   Follow up with mom of 48 hour old infant. Infant with 7 BF for 10-30 minutes, 3 formula feeds via bottle of 20-32 cc, 2 voids and 4 stools in the last 24 hours. Infant weight 6 pounds 11.1 ounces with 5% weight loss since birth. LATCH scores 9. Infant asleep in dad's arms.   Mom reports she feels BF is going well. She reports she does not feel fuller today but is able to express colostrum. Mom denies pain with BF. Mom report she is not pumping but wants to try later, she does not feel like it right now.   Mom denied questions/concerns. She denies need for [facility] assistance at this time. Enc mom to call out for assistance as needed.    Maternal Data Reason for exclusion: Mother's choice to formula and breast feed on admission Has patient been taught Hand Expression?: Yes Does the patient have breastfeeding experience prior to this delivery?: Yes  Feeding    LATCH Score                   Interventions    Lactation Tools Discussed/Used     Consult Status Consult Status: Follow-up Date: 03/30/18 Follow-up type: In-patient    Brittany Wilson 03/29/2018, 2:05 PM

## 2018-03-29 NOTE — Progress Notes (Signed)
POD #2 Doing ok, sore but better, hemorrhoid still uncomfortable Afeb, VSS Abd-soft, fundus firm, incision intact, pressure dressing removed-removed honeycomb with it, will replace honeycomb Continue routine care and measures for hemorrhoid

## 2018-03-30 MED ORDER — HYDROCORTISONE ACE-PRAMOXINE 2.5-1 % RE CREA: TOPICAL_CREAM | Freq: Four times a day (QID) | RECTAL | 1 refills | Status: DC

## 2018-03-30 MED ORDER — HYDROMORPHONE HCL 2 MG PO TABS: 2.0000 mg | ORAL_TABLET | ORAL | 0 refills | Status: DC | PRN

## 2018-03-30 MED ORDER — IBUPROFEN 800 MG PO TABS: 800.0000 mg | ORAL_TABLET | Freq: Three times a day (TID) | ORAL | 0 refills | Status: DC

## 2018-03-30 MED ORDER — DIBUCAINE 1 % RE OINT: 1.0000 "application " | TOPICAL_OINTMENT | RECTAL | 0 refills | Status: DC | PRN

## 2018-03-30 NOTE — Discharge Summary (Signed)
OB Discharge Summary     Patient Name: Brittany Wilson DOB: 03/28/1986 MRN: [id number]  Date of admission: 03/27/2018 Delivering MD: Sherian ReinBOVARD-STUCKERT, JODY   Date of discharge: 03/30/2018  Admitting diagnosis: VERSION Intrauterine pregnancy: 983w0d     Secondary diagnosis:  Principal Problem:   S/P cesarean section Active Problems:   Malpresentation before onset of labor, fetus 1   Failed external cephalic version   Anemia      Discharge diagnosis: Term Pregnancy Delivered and breech presentation                                                                                                Hospital course:  Sceduled C/S   32 y.o. yo [phone] at 263w0d was admitted to the hospital 03/27/2018 for external cephalic version  with the following indication:Malpresentation/breech.  ECV failed, so she was taken for c-section..  Membrane Rupture Time/Date: 1:26 PM ,03/27/2018   Patient delivered a Viable infant.03/27/2018  Details of operation can be found in separate operative note.  Pateint had an uncomplicated postpartum course except for large, prolapsed internal hemorrhoids evaluated by general surgery.  She is ambulating, tolerating a regular diet, passing flatus, and urinating well. Patient is discharged home in stable condition on  03/30/18         Physical exam  Vitals:   03/29/18 1508 03/29/18 1521 03/29/18 2143 03/30/18 0526  BP: (!) 97/58 99/66 95/61  94/71  Pulse: (!) 113 (!) 113 (!) 103 75  Resp: 16 20 18 16   Temp: 100 F (37.8 C) 100 F (37.8 C) 98.3 F (36.8 C) 98.3 F (36.8 C)  TempSrc: Oral Oral Oral Oral  SpO2: 100%  100% 99%  Weight:      Height:       General: alert Lochia: appropriate Uterine Fundus: firm Incision: Healing well with no significant drainage  Labs: Lab Results  Component Value Date   WBC 10.2 03/28/2018   HGB 7.2 (L) 03/28/2018   HCT 20.5 (L) 03/28/2018   MCV 59.6 (L) 03/28/2018   PLT 221 03/28/2018   No flowsheet data found.  Discharge  instruction: per After Visit Summary and "Baby and Me Booklet".  After visit meds:  Allergies as of 03/30/2018      Reactions   Shellfish Allergy       Medication List    TAKE these medications   dibucaine 1 % Oint Commonly known as:  NUPERCAINAL Place 1 application rectally as needed for hemorrhoids.   hydrocortisone-pramoxine 2.5-1 % rectal cream Commonly known as:  ANALPRAM-HC Place rectally 4 (four) times daily.   HYDROmorphone 2 MG tablet Commonly known as:  DILAUDID Take 1 tablet (2 mg total) by mouth every 4 (four) hours as needed for severe pain.   ibuprofen 800 MG tablet Commonly known as:  ADVIL,MOTRIN Take 1 tablet (800 mg total) by mouth every 8 (eight) hours.       Diet: routine diet  Activity: Advance as tolerated. Pelvic rest for 6 weeks.   Outpatient follow up:2 weeks  Newborn Data: Live born female  Birth Weight: 7 lb 0.2 oz (  3180 g) APGAR: 8, 9  Newborn Delivery   Birth date/time:  03/27/2018 13:28:00 Delivery type:  C-Section, Low Transverse Trial of labor:  No C-section categorization:  Primary     Baby Feeding: Breast Disposition:home with mother   03/30/2018 Brittany Nieceodd D Denell Cothern, MD

## 2018-03-30 NOTE — Progress Notes (Signed)
POD #3 Feeling better, still has hemorrhoids but not as uncomfortable Afeb, VSS Abd- soft, fundus firm, incision intact with new honeycomb D/c home

## 2018-04-03 ENCOUNTER — Inpatient Hospital Stay (HOSPITAL_COMMUNITY): Admission: RE | Admit: 2018-04-03 | Payer: BLUE CROSS/BLUE SHIELD | Source: Ambulatory Visit

## 2018-11-21 IMAGING — US US OB COMP LESS 14 WK
1 series · 15 of 28 positions shown · non-contrast
Comparison: None.

CLINICAL DATA: Cramping for 1 week. No vaginal bleeding. Clinical
data reports absence of fetal heart tones in office last week.

EXAM:
OBSTETRIC <14 WK ULTRASOUND
TECHNIQUE: Transabdominal ultrasound was performed for evaluation of the
gestation as well as the maternal uterus and adnexal regions.

[Series 1: us ob comp less 14 wk · 15 of 44 slices shown]
[im 1/44]
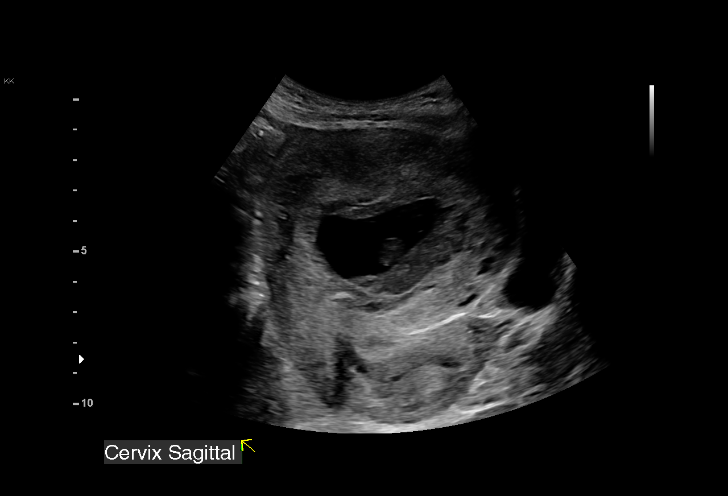
[im 4/44]
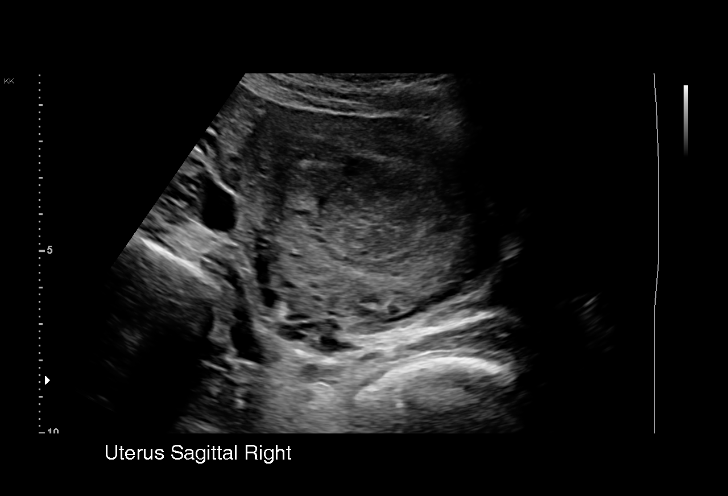
[im 7/44]
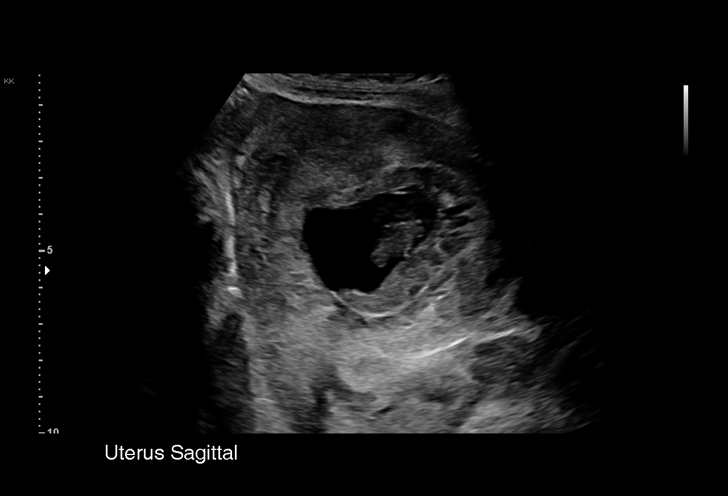
[im 10/44]
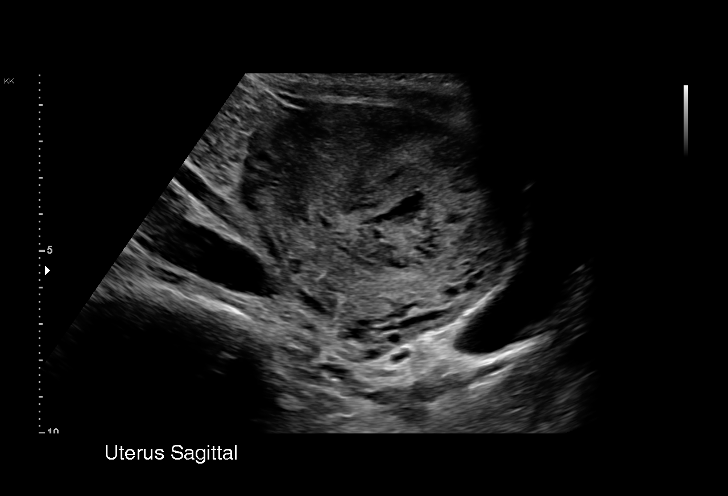
[im 13/44]
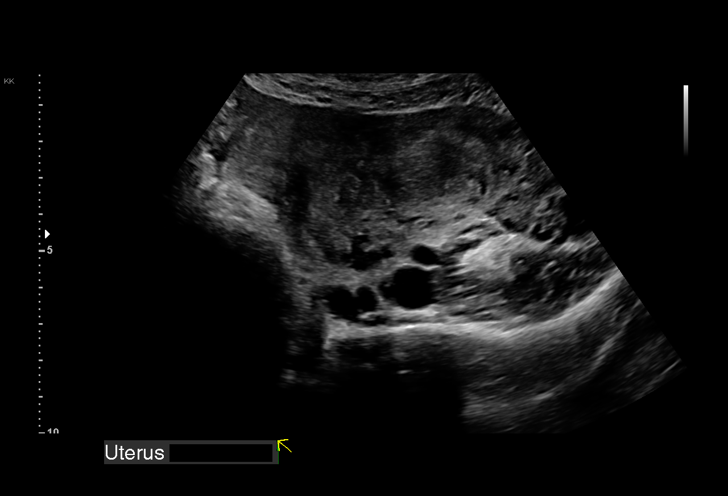
[im 16/44]
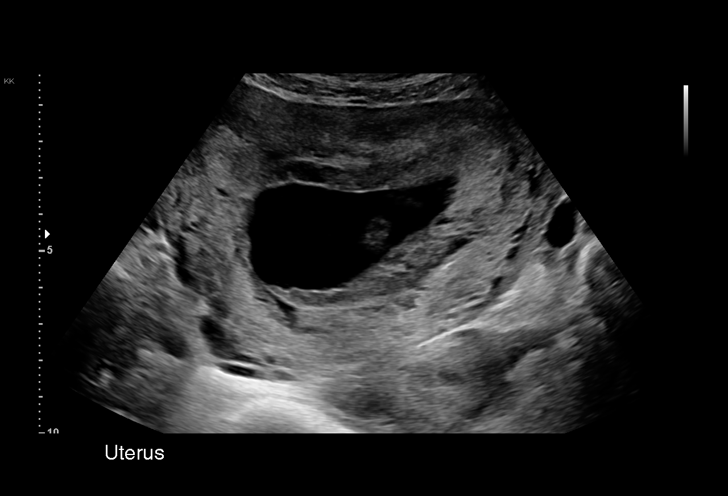
[im 20/44]
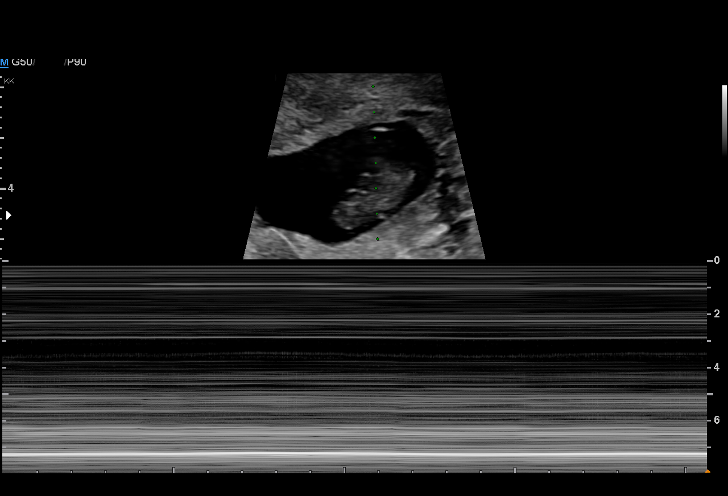
[im 23/44]
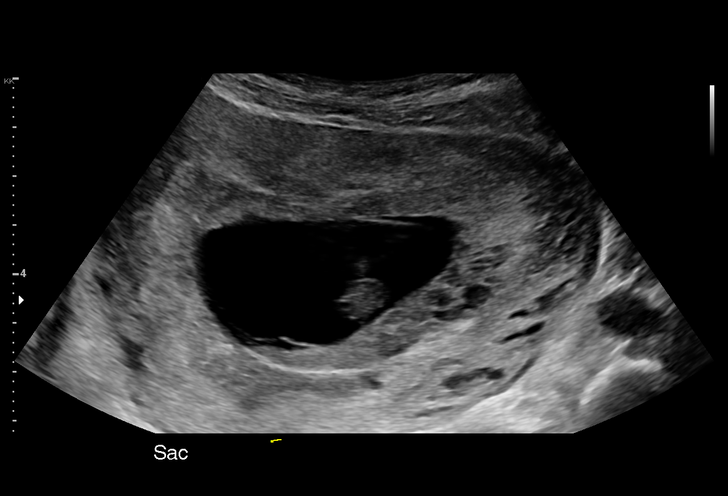
[im 24/44]
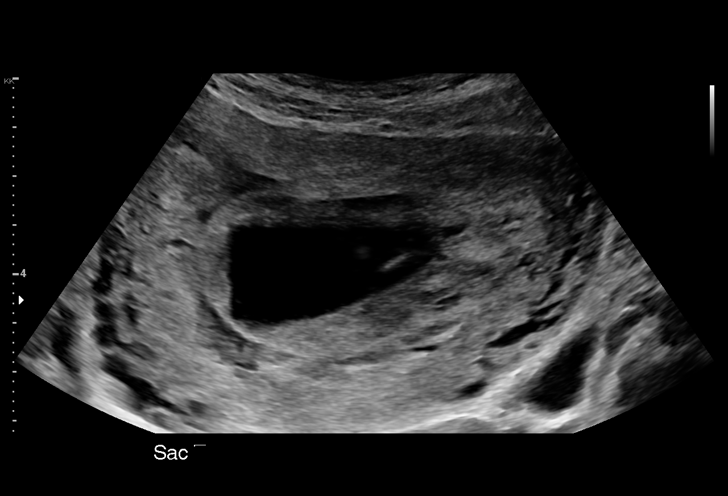
[im 28/44]
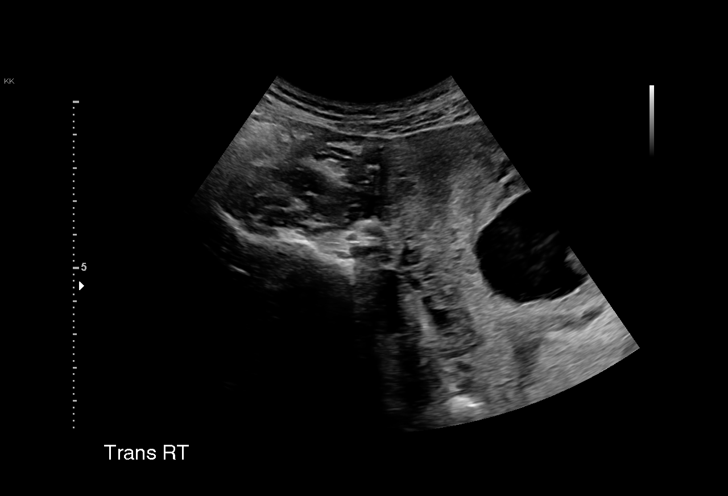
[im 31/44]
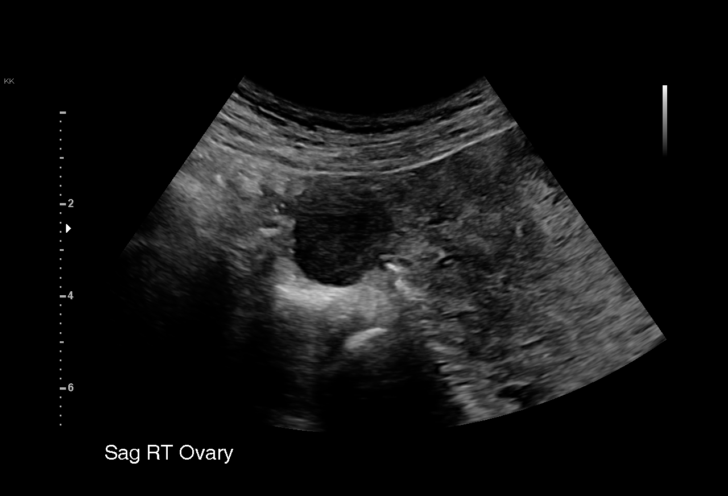
[im 34/44]
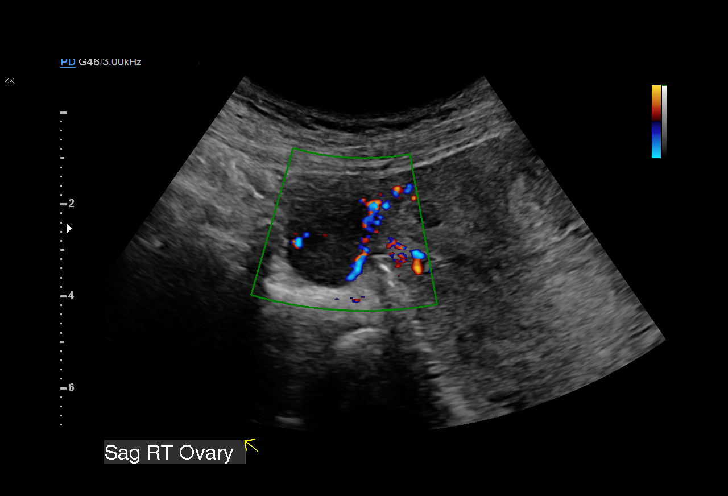
[im 37/44]
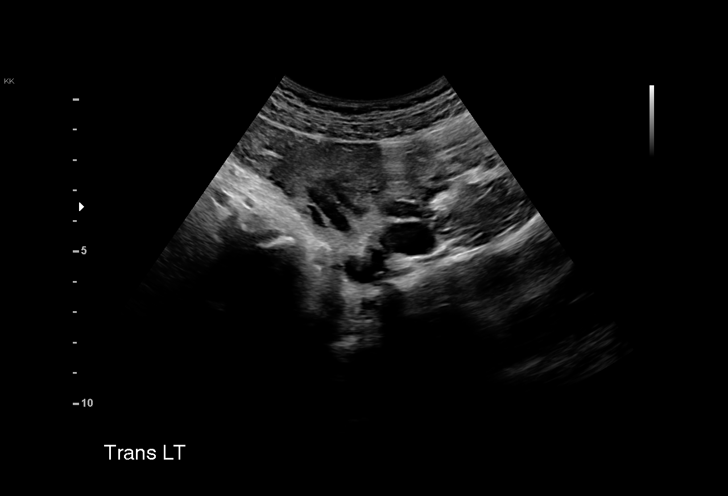
[im 40/44]
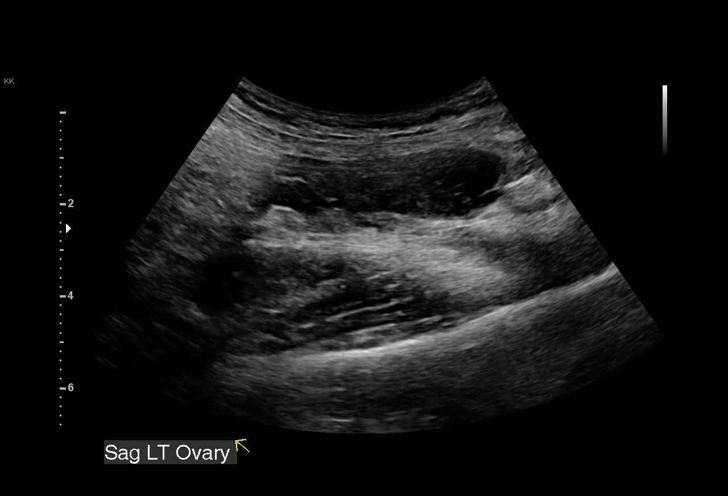
[im 44/44]
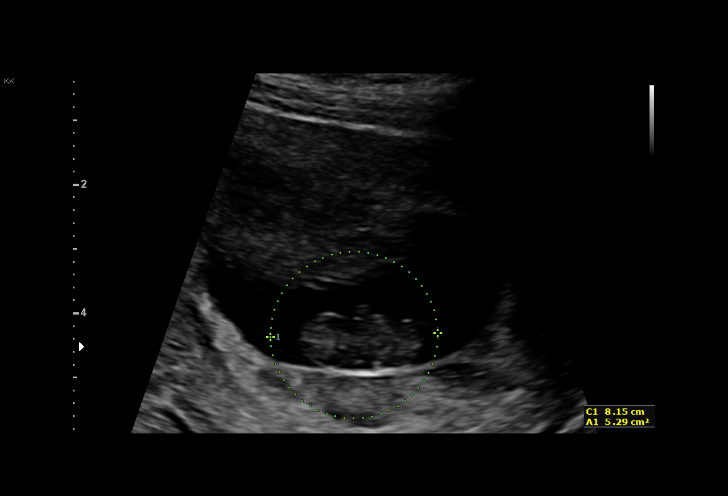

[15 of 28 positions shown; findings below may reference images not displayed]

FINDINGS: Intrauterine gestational sac: Single

Yolk sac:  None

Embryo:  Single

Cardiac Activity: Absent

Heart Rate: n/a bpm

CRL:   19.1  mm   8 w 3 d                  US EDC: 06/29/2017

Subchorionic hemorrhage:  None visualized.

Maternal uterus/adnexae: Unremarkable

No free fluid.
IMPRESSION: Crown-rump length of greater than 7 mm with no visible heartbeat.

Findings meet definitive criteria for failed pregnancy. This follows
SRU consensus guidelines: Diagnostic Criteria for Nonviable
Pregnancy Early in the First Trimester. N Engl J Med

## 2019-10-23 LAB — OB RESULTS CONSOLE HEPATITIS B SURFACE ANTIGEN: Hepatitis B Surface Ag: NEGATIVE

## 2019-10-23 LAB — HEPATITIS C ANTIBODY: HCV Ab: NEGATIVE

## 2019-10-23 LAB — OB RESULTS CONSOLE RUBELLA ANTIBODY, IGM: Rubella: IMMUNE

## 2020-02-03 LAB — OB RESULTS CONSOLE HIV ANTIBODY (ROUTINE TESTING): HIV: NONREACTIVE

## 2020-02-09 ENCOUNTER — Encounter: Payer: Self-pay | Admitting: Hematology

## 2020-02-09 ENCOUNTER — Telehealth: Payer: Self-pay | Admitting: Hematology

## 2020-02-09 NOTE — Telephone Encounter (Signed)
Received a new hem referral from Dr. Reina Fuse for anemia in pregnancy. Ms. Brittany Wilson has been cld and scheduled to see Dr. Candise Che on 7/13 at 1pm. Pt aware to arrive 15 minutes early. Letter mailed.

## 2020-02-24 ENCOUNTER — Inpatient Hospital Stay: Payer: Medicaid Other | Admitting: Hematology

## 2020-02-24 ENCOUNTER — Other Ambulatory Visit: Payer: Self-pay

## 2020-02-24 ENCOUNTER — Telehealth: Payer: Self-pay | Admitting: Hematology

## 2020-02-24 NOTE — Telephone Encounter (Signed)
Rescheduled appt from 7/13 to 7/21. Pt is aware of appt. Printed appt calendar for pt.

## 2020-03-03 ENCOUNTER — Inpatient Hospital Stay: Payer: Medicaid Other

## 2020-03-03 ENCOUNTER — Inpatient Hospital Stay: Payer: Medicaid Other | Attending: Hematology | Admitting: Hematology

## 2020-03-03 ENCOUNTER — Other Ambulatory Visit: Payer: Self-pay

## 2020-03-03 VITALS — BP 106/73 | HR 83 | Temp 97.9°F | Resp 18 | Ht 60.0 in | Wt 130.5 lb

## 2020-03-03 DIAGNOSIS — E538 Deficiency of other specified B group vitamins: Secondary | ICD-10-CM | POA: Diagnosis not present

## 2020-03-03 DIAGNOSIS — Z3A32 32 weeks gestation of pregnancy: Secondary | ICD-10-CM | POA: Diagnosis not present

## 2020-03-03 DIAGNOSIS — D582 Other hemoglobinopathies: Secondary | ICD-10-CM

## 2020-03-03 DIAGNOSIS — D509 Iron deficiency anemia, unspecified: Secondary | ICD-10-CM | POA: Diagnosis not present

## 2020-03-03 DIAGNOSIS — O99013 Anemia complicating pregnancy, third trimester: Secondary | ICD-10-CM

## 2020-03-03 DIAGNOSIS — O99891 Other specified diseases and conditions complicating pregnancy: Secondary | ICD-10-CM | POA: Diagnosis not present

## 2020-03-03 LAB — CMP (CANCER CENTER ONLY)
ALT: 14 U/L (ref 0–44)
AST: 14 U/L — ABNORMAL LOW (ref 15–41)
Albumin: 2.9 g/dL — ABNORMAL LOW (ref 3.5–5.0)
Alkaline Phosphatase: 87 U/L (ref 38–126)
Anion gap: 8 (ref 5–15)
BUN: 7 mg/dL (ref 6–20)
CO2: 22 mmol/L (ref 22–32)
Calcium: 9 mg/dL (ref 8.9–10.3)
Chloride: 106 mmol/L (ref 98–111)
Creatinine: 0.6 mg/dL (ref 0.44–1.00)
GFR, Est AFR Am: >60 mL/min (ref >60)
GFR, Estimated: >60 mL/min (ref >60)
Glucose, Bld: 75 mg/dL (ref 70–99)
Potassium: 3.9 mmol/L (ref 3.5–5.1)
Sodium: 136 mmol/L (ref 135–145)
Total Bilirubin: 0.5 mg/dL (ref 0.3–1.2)
Total Protein: 7.3 g/dL (ref 6.5–8.1)

## 2020-03-03 LAB — CBC WITH DIFFERENTIAL/PLATELET
Abs Immature Granulocytes: 0.03 10*3/uL (ref 0.00–0.07)
Basophils Absolute: 0 10*3/uL (ref 0.0–0.1)
Basophils Relative: 0 %
Eosinophils Absolute: 0.6 10*3/uL — ABNORMAL HIGH (ref 0.0–0.5)
Eosinophils Relative: 7 %
HCT: 26.9 % — ABNORMAL LOW (ref 36.0–46.0)
Hemoglobin: 9 g/dL — ABNORMAL LOW (ref 12.0–15.0)
Immature Granulocytes: 0 %
Lymphocytes Relative: 23 %
Lymphs Abs: 1.9 10*3/uL (ref 0.7–4.0)
MCH: 20.6 pg — ABNORMAL LOW (ref 26.0–34.0)
MCHC: 33.5 g/dL (ref 30.0–36.0)
MCV: 61.6 fL — ABNORMAL LOW (ref 80.0–100.0)
Monocytes Absolute: 0.5 10*3/uL (ref 0.1–1.0)
Monocytes Relative: 5 %
Neutro Abs: 5.3 10*3/uL (ref 1.7–7.7)
Neutrophils Relative %: 65 %
Platelets: 347 10*3/uL (ref 150–400)
RBC: 4.37 MIL/uL (ref 3.87–5.11)
RDW: 15.5 % (ref 11.5–15.5)
WBC: 8.3 10*3/uL (ref 4.0–10.5)
nRBC: 0 % (ref 0.0–0.2)

## 2020-03-03 LAB — TYPE AND SCREEN
ABO/RH(D): O POS
Antibody Screen: NEGATIVE

## 2020-03-03 LAB — IRON AND TIBC
Iron: 47 ug/dL (ref 41–142)
Saturation Ratios: 8 % — ABNORMAL LOW (ref 21–57)
TIBC: 560 ug/dL — ABNORMAL HIGH (ref 236–444)
UIBC: 513 ug/dL — ABNORMAL HIGH (ref 120–384)

## 2020-03-03 LAB — RETICULOCYTES
Immature Retic Fract: 32.2 % — ABNORMAL HIGH (ref 2.3–15.9)
RBC.: 4.37 MIL/uL (ref 3.87–5.11)
Retic Count, Absolute: 89.1 10*3/uL (ref 19.0–186.0)
Retic Ct Pct: 2 % (ref 0.4–3.1)

## 2020-03-03 LAB — VITAMIN B12: Vitamin B-12: 195 pg/mL (ref 180–914)

## 2020-03-03 LAB — LACTATE DEHYDROGENASE: LDH: 152 U/L (ref 98–192)

## 2020-03-03 LAB — FERRITIN: Ferritin: 5 ng/mL — ABNORMAL LOW (ref 11–307)

## 2020-03-03 NOTE — Progress Notes (Signed)
HEMATOLOGY/ONCOLOGY CONSULTATION NOTE  Date of Service: 03/03/2020  Patient Care Team: Patient, No Pcp Per as PCP - General (General Practice)  CHIEF COMPLAINTS/PURPOSE OF CONSULTATION:  Anemia in pregnancy  HISTORY OF PRESENTING ILLNESS:  Brittany Wilson is a wonderful 34 y.o. female who has been referred to [place] by Dr. Reina Fuse for evaluation and management of anemia in pregnancy. We are joined today by her aunt, Hollie Beach, via phone. The pt reports that she is doing well overall.   The pt reports that she is currently [redacted] weeks pregnant with her fourth child. She has three boys; ages 48, 66, and 2. She has not required blood transfusions or iron infusions with her previous pregnancies. Pt was given PO Iron to take during her last pregnancy and was able to deliver via C-section. Pt is currently only taking prenatal vitamins and Tylenol. Pt was not told to take PO Iron during this pregnancy. She is planning on having an elective C-section with this pregnancy. She will get a tubal ligation after birth. Pt has been told that that her oldest two children have anemia and iron deficiency.   Most recent lab results (02/03/2020) of CBC is as follows: all values are WNL except for Hgb at 8.9, HCT at 28.0, MCV at 66, MCH at 21.0, RDW at 17.1, Eos Abs at 0.07K.  On review of systems, pt reports lightheadedness, dizziness, fatigue, leg swelling and denies abnormal bleeding, abdominal pain and any other symptoms.   On PMHx the pt reports C-section.  On Family Hx the pt reports that her husband's father has Hemoglobin E disease and her nieces have thalassemia.    MEDICAL HISTORY:  Past Medical History:  Diagnosis Date  . Failed external cephalic version 0/30/0923  . Malpresentation before onset of labor, fetus 1 03/27/2018  . Medical history non-contributory     SURGICAL HISTORY: Past Surgical History:  Procedure Laterality Date  . CESAREAN SECTION N/A 03/27/2018   Procedure: CESAREAN SECTION;   Surgeon: Sherian Rein, MD;  Location: [facility];  Service: Obstetrics;  Laterality: N/A;    SOCIAL HISTORY: Social History   Socioeconomic History  . Marital status: Single    Spouse name: Not on file  . Number of children: Not on file  . Years of education: Not on file  . Highest education level: Not on file  Occupational History  . Not on file  Tobacco Use  . Smoking status: Never Smoker  . Smokeless tobacco: Never Used  Vaping Use  . Vaping Use: Never used  Substance and Sexual Activity  . Alcohol use: No  . Drug use: No  . Sexual activity: Yes  Other Topics Concern  . Not on file  Social History Narrative  . Not on file   Social Determinants of Health   Financial Resource Strain:   . Difficulty of Paying Living Expenses:   Food Insecurity:   . Worried About Programme researcher, broadcasting/film/video in the Last Year:   . Barista in the Last Year:   Transportation Needs:   . Freight forwarder (Medical):   Marland Kitchen Lack of Transportation (Non-Medical):   Physical Activity:   . Days of Exercise per Week:   . Minutes of Exercise per Session:   Stress:   . Feeling of Stress :   Social Connections:   . Frequency of Communication with Friends and Family:   . Frequency of Social Gatherings with Friends and Family:   . Attends Religious Services:   .  Active Member of Clubs or Organizations:   . Attends Banker Meetings:   Marland Kitchen Marital Status:   Intimate Partner Violence:   . Fear of Current or Ex-Partner:   . Emotionally Abused:   Marland Kitchen Physically Abused:   . Sexually Abused:     FAMILY HISTORY: Family History  Problem Relation Age of Onset  . Liver cancer Maternal Grandfather     ALLERGIES:  is allergic to shellfish allergy.  MEDICATIONS:  Current Outpatient Medications  Medication Sig Dispense Refill  . dibucaine (NUPERCAINAL) 1 % OINT Place 1 application rectally as needed for hemorrhoids. 1 Tube 0  . hydrocortisone-pramoxine (ANALPRAM-HC)  2.5-1 % rectal cream Place rectally 4 (four) times daily. 30 g 1  . HYDROmorphone (DILAUDID) 2 MG tablet Take 1 tablet (2 mg total) by mouth every 4 (four) hours as needed for severe pain. 15 tablet 0  . ibuprofen (ADVIL,MOTRIN) 800 MG tablet Take 1 tablet (800 mg total) by mouth every 8 (eight) hours. 30 tablet 0   No current facility-administered medications for this visit.    REVIEW OF SYSTEMS:    10 Point review of Systems was done is negative except as noted above.  PHYSICAL EXAMINATION: ECOG PERFORMANCE STATUS: 0 - Asymptomatic  . Vitals:   03/03/20 1309  BP: 106/73  Pulse: 83  Resp: 18  Temp: 97.9 F (36.6 C)  SpO2: 100%   Filed Weights   03/03/20 1309  Weight: 130 lb 8 oz (59.2 kg)   .Body mass index is 25.49 kg/m.  Exam was given in a chair   GENERAL:alert, in no acute distress and comfortable SKIN: no acute rashes, no significant lesions EYES: conjunctiva are pink and non-injected, sclera anicteric OROPHARYNX: MMM, no exudates, no oropharyngeal erythema or ulceration NECK: supple, no JVD LYMPH:  no palpable lymphadenopathy in the cervical, axillary or inguinal regions LUNGS: clear to auscultation b/l with normal respiratory effort HEART: regular rate & rhythm ABDOMEN:  normoactive bowel sounds , non tender, not distended. Extremity: no pedal edema PSYCH: alert & oriented x 3 with fluent speech NEURO: no focal motor/sensory deficits  LABORATORY DATA:  I have reviewed the data as listed  . CBC Latest Ref Rng & Units 03/03/2020 03/03/2020 03/28/2018  WBC 4.0 - 10.5 K/uL 8.3 - 10.2  Hemoglobin 12.0 - 15.0 g/dL 9.0(L) - 7.2(L)  Hematocrit 34.0 - 46.6 % 29.0(L) 26.9(L) 20.5(L)  Platelets 150 - 400 K/uL 347 - 221    . CMP Latest Ref Rng & Units 03/03/2020  Glucose 70 - 99 mg/dL 75  BUN 6 - 20 mg/dL 7  Creatinine 5.64 - 3.32 mg/dL 9.51  Sodium 884 - 166 mmol/L 136  Potassium 3.5 - 5.1 mmol/L 3.9  Chloride 98 - 111 mmol/L 106  CO2 22 - 32 mmol/L 22    Calcium 8.9 - 10.3 mg/dL 9.0  Total Protein 6.5 - 8.1 g/dL 7.3  Total Bilirubin 0.3 - 1.2 mg/dL 0.5  Alkaline Phos 38 - 126 U/L 87  AST 15 - 41 U/L 14(L)  ALT 0 - 44 U/L 14   . Lab Results  Component Value Date   IRON 47 03/03/2020   TIBC 560 (H) 03/03/2020   IRONPCTSAT 8 (L) 03/03/2020   (Iron and TIBC)  Lab Results  Component Value Date   FERRITIN 5 (L) 03/03/2020    Component     Latest Ref Rng & Units 03/03/2020  Folate, Hemolysate     Not Estab. ng/mL 249.0  HCT  34.0 - 46.6 % 29.0 (L)  Folate, RBC     >498 ng/mL 859  Vitamin B12     180 - 914 pg/mL 195   Component     Latest Ref Rng & Units 03/03/2020  HGB F     0.0 - 2.0 % 5.4 (H)  Hgb A     96.4 - 98.8 % 0.0 (L)  HGB A2     1.8 - 3.2 % Comment  HGB S     0.0 % 0.0  HGB C     0.0 % 0.0  Hgb E     0.0 % 90.3 (H)  HGB VARIANT     0.0 % 0.0  Final Interpretation:      Comment   Hgb Fractionation by HPLC      No reference range information available      Resulting Lab: [facility]      Comments:           (NOTE)           Hemoglobin pattern and concentrations are consistent with a           homozygous E patient. Suggest clinical and hematologic            correlation.           Homozygous E Interpretation Ranges           Hgb F   0.0 - 15.0%           Hgb E        >80.0%           Hgb A2       > 5.0%           Performed At: [facility]...  RADIOGRAPHIC STUDIES: I have personally reviewed the radiological images as listed and agreed with the findings in the report. No results found.  ASSESSMENT & PLAN:   34 yo female with   1) Microcytic Anemia  hgb @ 9.  Multifactorial due to Iron deficiency + newly diagnosed Homozygous HgB E  Additional elements causing anemia - hemodlution in 3rd trimester of pregnancy.  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 -Advised pt that her baseline Hgb appears to be around 10, but drops during pregnancy either due to iron deficiency or an increased volume of blood.  -Advised pt that it is preferred when Hgb >9 for C-section. -Advised pt that significant anemia can place strain on the heart during childbirth.  -Advised pt that it will be helpful to know if pt has an inheritable blood disorder for her children's healthcare.  -Will get labs today  -Will see back in 1 week via phone -will discuss Iron, B12 replacement and counseling regarding newly diagnosed hemoglobinopathy  FOLLOW UP: Labs today Phone visit in 1 week   All of the patients questions were answered with apparent satisfaction. The patient knows to call the clinic with any problems, questions or concerns.  I spent counseling the patient face to face. The total time spent in the appointment was 45 minutes and more than 50% was on counseling and direct patient cares.    Wyvonnia Lora MD MS AAHIVMS [facility] Hematology/Oncology  Physician [facility]  (Office):       [phone] (Work cell):  [phone] (Fax):           [phone]  03/03/2020 2:11 PM  I, Carollee HerterJazzmine Knight, am acting as a scribe for Dr. Wyvonnia LoraGautam Helyne Genther.   .I have reviewed the above documentation for accuracy and completeness, and I agree with the above. Johney Maine.Zanae Kuehnle Kishore Avni Traore MD

## 2020-03-04 ENCOUNTER — Encounter: Payer: Medicaid Other | Admitting: Hematology

## 2020-03-04 LAB — HAPTOGLOBIN: Haptoglobin: 47 mg/dL (ref 33–278)

## 2020-03-04 LAB — FOLATE RBC
Folate, Hemolysate: 249 ng/mL
Folate, RBC: 859 ng/mL (ref >498)
Hematocrit: 29 % — ABNORMAL LOW (ref 34.0–46.6)

## 2020-03-05 ENCOUNTER — Telehealth: Payer: Self-pay | Admitting: Hematology

## 2020-03-05 NOTE — Telephone Encounter (Signed)
Scheduled per 07/21 los, patient has been called and notified. °

## 2020-03-08 LAB — HGB FRACTIONATION CASCADE: Hgb A2: 4.3 % — ABNORMAL HIGH (ref 1.8–3.2)

## 2020-03-08 LAB — HGB FRACTIONATION BY HPLC
Hgb A: 0 % — ABNORMAL LOW (ref 96.4–98.8)
Hgb C: 0 %
Hgb E: 90.3 % — ABNORMAL HIGH
Hgb F: 5.4 % — ABNORMAL HIGH (ref 0.0–2.0)
Hgb S: 0 %
Hgb Variant: 0 %

## 2020-03-09 ENCOUNTER — Telehealth: Payer: Self-pay | Admitting: Hematology

## 2020-03-10 ENCOUNTER — Inpatient Hospital Stay (HOSPITAL_BASED_OUTPATIENT_CLINIC_OR_DEPARTMENT_OTHER): Payer: Medicaid Other | Admitting: Hematology

## 2020-03-10 DIAGNOSIS — O99891 Other specified diseases and conditions complicating pregnancy: Secondary | ICD-10-CM | POA: Diagnosis not present

## 2020-03-10 DIAGNOSIS — D582 Other hemoglobinopathies: Secondary | ICD-10-CM

## 2020-03-10 DIAGNOSIS — O99013 Anemia complicating pregnancy, third trimester: Secondary | ICD-10-CM

## 2020-03-10 DIAGNOSIS — D509 Iron deficiency anemia, unspecified: Secondary | ICD-10-CM | POA: Diagnosis not present

## 2020-03-10 MED ORDER — B-12 1000 MCG SL SUBL: 1000.0000 ug | SUBLINGUAL_TABLET | Freq: Every day | SUBLINGUAL | 3 refills | Status: DC

## 2020-03-10 MED ORDER — POLYSACCHARIDE IRON COMPLEX 150 MG PO CAPS: 150.0000 mg | ORAL_CAPSULE | Freq: Two times a day (BID) | ORAL | 3 refills | Status: DC

## 2020-03-10 NOTE — Progress Notes (Signed)
HEMATOLOGY/ONCOLOGY CLINIC NOTE  Date of Service: 03/10/2020  Patient Care Team: Patient, No Pcp Per as PCP - General (General Practice)  CHIEF COMPLAINTS/PURPOSE OF CONSULTATION:  Anemia in pregnancy  HISTORY OF PRESENTING ILLNESS:  Brittany Wilson is a wonderful 34 y.o. female who has been referred to [place] by Dr. Reina Fuse for evaluation and management of anemia in pregnancy. We are joined today by her aunt, Hollie Beach, via phone. The pt reports that she is doing well overall.   The pt reports that she is currently [redacted] weeks pregnant with her fourth child. She has three boys; ages 75, 54, and 2. She has not required blood transfusions or iron infusions with her previous pregnancies. Pt was given PO Iron to take during her last pregnancy and was able to deliver via C-section. Pt is currently only taking prenatal vitamins and Tylenol. Pt was not told to take PO Iron during this pregnancy. She is planning on having an elective C-section with this pregnancy. She will get a tubal ligation after birth. Pt has been told that that her oldest two children have anemia and iron deficiency.   Most recent lab results (02/03/2020) of CBC is as follows: all values are WNL except for Hgb at 8.9, HCT at 28.0, MCV at 66, MCH at 21.0, RDW at 17.1, Eos Abs at 0.07K.  On review of systems, pt reports lightheadedness, dizziness, fatigue, leg swelling and denies abnormal bleeding, abdominal pain and any other symptoms.   On PMHx the pt reports C-section.  On Family Hx the pt reports that her husband's father has Hemoglobin E disease and her nieces have thalassemia.   INTERVAL HISTORY:  I connected with  Patrizia T Dettinger on 03/10/20 by telephone and verified that I am speaking with the correct person using two identifiers.   I discussed the limitations of evaluation and management by telemedicine. The patient expressed understanding and agreed to proceed.  Other persons participating in the visit and their role in the  encounter:       -Carollee Herter, Medical Scribe      -Pt's Aunt  Patient's location: Home Provider's location: [facility] [employer] is a wonderful 34 y.o. female who is here for evaluation and management of anemia in pregnancy. The patient's last visit with [place] was on 03/03/2020. The pt reports that she is doing well overall.  The pt reports that she is well and denies any new concerns. Pt is still planning on having a C-section on 04/20/2020.   Lab results (03/03/20) of CBC w/diff and CMP is as follows: all values are WNL except for Hgb at 9.0, HCT at 26.9, MCV at 61.6, MCH at 20.6, Eos Abs at 0.6K, Albumin at 2.9, AST at 14. 03/03/2020 Ferritin at 5 03/03/2020 LDH at 152 03/03/2020 Haptoglobin at 47 03/03/2020 Vitamin B12 at 195 03/03/2020 Folate Hemolysate at 249.0, HCT at 29.0, Folate RBC at 859 03/03/2020 Iron Panel is as follows: Iron at 47, TIBC at 560, Sat Ratios at 8, UIBC at 513 03/03/2020 Reticulocytes is as follows: Retic Ct Pct at 2.0, RBC at 4.37, Retic Ct Abs at 89.1, Immature Retic Fract at 32.2 03/03/2020 Hgb Fractionation reveals: Hgb F at 5.4, Hgb A at 0.0, Hgb A2 at 4.3, Hgb S at 0.0, Hgb C at 0.0, Hgb E at 90.3   On review of systems, pt denies any other symptoms.    MEDICAL HISTORY:  Past Medical History:  Diagnosis Date  . Failed external  cephalic version 6/04/54098/14/2019  . Malpresentation before onset of labor, fetus 1 03/27/2018  . Medical history non-contributory     SURGICAL HISTORY: Past Surgical History:  Procedure Laterality Date  . CESAREAN SECTION N/A 03/27/2018   Procedure: CESAREAN SECTION;  Surgeon: Sherian ReinBovard-Stuckert, Jody, MD;  [facility];  Service: Obstetrics;  Laterality: N/A;    SOCIAL HISTORY: Social History   Socioeconomic History  . Marital status: Single    Spouse name: Not on file  . Number of children: Not on file  . Years of education: Not on file  . Highest education level: Not on file    Occupational History  . Not on file  Tobacco Use  . Smoking status: Never Smoker  . Smokeless tobacco: Never Used  Vaping Use  . Vaping Use: Never used  Substance and Sexual Activity  . Alcohol use: No  . Drug use: No  . Sexual activity: Yes  Other Topics Concern  . Not on file  Social History Narrative  . Not on file   Social Determinants of Health   Financial Resource Strain:   . Difficulty of Paying Living Expenses:   Food Insecurity:   . Worried About Programme researcher, broadcasting/film/videounning Out of Food in the Last Year:   . Baristaan Out of Food in the Last Year:   Transportation Needs:   . Freight forwarderLack of Transportation (Medical):   Marland Kitchen. Lack of Transportation (Non-Medical):   Physical Activity:   . Days of Exercise per Week:   . Minutes of Exercise per Session:   Stress:   . Feeling of Stress :   Social Connections:   . Frequency of Communication with Friends and Family:   . Frequency of Social Gatherings with Friends and Family:   . Attends Religious Services:   . Active Member of Clubs or Organizations:   . Attends BankerClub or Organization Meetings:   Marland Kitchen. Marital Status:   Intimate Partner Violence:   . Fear of Current or Ex-Partner:   . Emotionally Abused:   Marland Kitchen. Physically Abused:   . Sexually Abused:     FAMILY HISTORY: Family History  Problem Relation Age of Onset  . Liver cancer Maternal Grandfather     ALLERGIES:  is allergic to shellfish allergy.  MEDICATIONS:  Current Outpatient Medications  Medication Sig Dispense Refill  . Cyanocobalamin (B-12) 1000 MCG SUBL Place 1,000 mcg under the tongue daily. 30 tablet 3  . iron polysaccharides (NIFEREX) 150 MG capsule Take 1 capsule (150 mg total) by mouth 2 (two) times daily. 60 capsule 3  . Prenatal Vit-Fe Fumarate-FA (PRENATAL VITAMINS PO) Take 1 capsule by mouth daily.     No current facility-administered medications for this visit.    REVIEW OF SYSTEMS:   A 10+ POINT REVIEW OF SYSTEMS WAS OBTAINED including neurology, dermatology, psychiatry,  cardiac, respiratory, lymph, extremities, GI, GU, Musculoskeletal, constitutional, breasts, reproductive, HEENT.  All pertinent positives are noted in the HPI.  All others are negative.   PHYSICAL EXAMINATION: ECOG PERFORMANCE STATUS: 0 - Asymptomatic  .Telehealth visit   LABORATORY DATA:  I have reviewed the data as listed  . CBC Latest Ref Rng & Units 03/03/2020 03/03/2020 03/28/2018  WBC 4.0 - 10.5 K/uL 8.3 - 10.2  Hemoglobin 12.0 - 15.0 g/dL 9.0(L) - 7.2(L)  Hematocrit 34.0 - 46.6 % 29.0(L) 26.9(L) 20.5(L)  Platelets 150 - 400 K/uL 347 - 221    . CMP Latest Ref Rng & Units 03/03/2020  Glucose 70 - 99 mg/dL 75  BUN 6 -  20 mg/dL 7  Creatinine 6.28 - 3.15 mg/dL 1.76  Sodium 160 - 737 mmol/L 136  Potassium 3.5 - 5.1 mmol/L 3.9  Chloride 98 - 111 mmol/L 106  CO2 22 - 32 mmol/L 22  Calcium 8.9 - 10.3 mg/dL 9.0  Total Protein 6.5 - 8.1 g/dL 7.3  Total Bilirubin 0.3 - 1.2 mg/dL 0.5  Alkaline Phos 38 - 126 U/L 87  AST 15 - 41 U/L 14(L)  ALT 0 - 44 U/L 14   . Lab Results  Component Value Date   IRON 47 03/03/2020   TIBC 560 (H) 03/03/2020   IRONPCTSAT 8 (L) 03/03/2020   (Iron and TIBC)  Lab Results  Component Value Date   FERRITIN 5 (L) 03/03/2020    Component     Latest Ref Rng & Units 03/03/2020  Folate, Hemolysate     Not Estab. ng/mL 249.0  HCT     34.0 - 46.6 % 29.0 (L)  Folate, RBC     >498 ng/mL 859  Vitamin B12     180 - 914 pg/mL 195   Component     Latest Ref Rng & Units 03/03/2020  HGB F     0.0 - 2.0 % 5.4 (H)  Hgb A     96.4 - 98.8 % 0.0 (L)  HGB A2     1.8 - 3.2 % Comment  HGB S     0.0 % 0.0  HGB C     0.0 % 0.0  Hgb E     0.0 % 90.3 (H)  HGB VARIANT     0.0 % 0.0  Final Interpretation:      Comment   Hgb Fractionation by HPLC      No reference range information available      Resulting Lab: [facility]      Comments:           (NOTE)           Hemoglobin pattern and concentrations are consistent with a            homozygous E patient. Suggest clinical and hematologic            correlation.           Homozygous E Interpretation Ranges           Hgb F   0.0 - 15.0%           Hgb E        >80.0%           Hgb A2       > 5.0%           Performed At: [facility]...  Alpha-Thalassemia Comment:   Comment: (NOTE)  Test: Alpha-Thalassemia, DNA Analysis  Result:    Negative, alpha alpha/alpha alpha     RADIOGRAPHIC STUDIES: I have personally reviewed the radiological images as listed and agreed with the findings in the report. No results found.  ASSESSMENT & PLAN:   34 yo female with   1) Microcytic Anemia  hgb @ 9.  Multifactorial due to Iron deficiency + newly diagnosed Homozygous HgB E  Additional elements causing anemia - hemodlution in 3rd trimester of pregnancy.  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 -Recommend pt discuss Hgb E status with her children's Pediatrician. -Discussed replacing Iron IV vs PO considering pt's upcoming C-section.  -Advised pt that IV Iron will replace Iron levels quickly and that PO Iron works slower.  -Would recommend that we replace some Iron IV to prevent the need for blood transfusion before/after C-section. -Advised pt that IV [facility] would [facility] at [facility] with maternal-fetal monitoring.  -Recommend pt begin 1000 mcg Vitamin B12 SL daily -Iron polysaccharide 150mg  po BID  -Continue prenatal  vitamins as prescribed -Will give IV Venofer twice weekly x3 -Rx Iron Polysaccharide, Vitamin B12 -Will see back in 4 weeks with labs    FOLLOW UP: -IV Venofer twice weekly x 3 doses at [facility] -RTC with [facility] with labs in 4 weeks    The total time spent in the appt was 30 minutes and more than 50% was on counseling and direct patient cares.  All of the patient's questions were answered with apparent satisfaction. The patient knows to call the clinic with any problems, questions or concerns.    Candise Che MD MS AAHIVMS [facility] Hematology/Oncology Physician [facility]  (Office):       [phone] (Work cell):  [phone] (Fax):           [phone]  03/10/2020 4:38 PM  I, 03/12/2020, am acting as a scribe for Dr. Carollee Herter.   .I have reviewed the above documentation for accuracy and completeness, and I agree with the above. Wyvonnia Lora MD

## 2020-03-11 ENCOUNTER — Telehealth: Payer: Self-pay | Admitting: *Deleted

## 2020-03-11 LAB — ALPHA-THALASSEMIA GENOTYPR

## 2020-03-11 NOTE — Telephone Encounter (Signed)
Per Dr. Candise Che, schedule patient for IV Venofer twice weekly x 3 doses at [facility]  [facility] - scheduled [facility] appt at [facility] Monday 8/2. Patient to arrive at main entrance [facility], go to registration, they will direct patient to Day Center following registration. Day Center will schedule remaining appointments.  Contacted patient with this information. She requested that time of appt and address be sent via My Chart.

## 2020-03-15 ENCOUNTER — Other Ambulatory Visit: Payer: Self-pay

## 2020-03-15 ENCOUNTER — Ambulatory Visit (HOSPITAL_COMMUNITY)
Admission: RE | Admit: 2020-03-15 | Discharge: 2020-03-15 | Disposition: A | Discharge status: Home / Self Care | Payer: Medicaid Other | Source: Ambulatory Visit | Attending: Hematology | Admitting: Hematology

## 2020-03-15 DIAGNOSIS — O99013 Anemia complicating pregnancy, third trimester: Secondary | ICD-10-CM | POA: Insufficient documentation

## 2020-03-15 DIAGNOSIS — D509 Iron deficiency anemia, unspecified: Secondary | ICD-10-CM | POA: Diagnosis present

## 2020-03-15 MED ORDER — ACETAMINOPHEN 325 MG PO TABS
ORAL_TABLET | ORAL | Status: AC
  Administered 2020-03-15: 650 mg
  Filled 2020-03-15: qty 2

## 2020-03-15 MED ORDER — LORATADINE 10 MG PO TABS: 10.0000 mg | ORAL_TABLET | Freq: Every day | ORAL | Status: DC

## 2020-03-15 MED ORDER — SODIUM CHLORIDE 0.9 % IV SOLN
200.0000 mg | INTRAVENOUS | Status: DC
  Administered 2020-03-15: 200 mg via INTRAVENOUS
  Filled 2020-03-15: qty 10

## 2020-03-15 MED ORDER — ACETAMINOPHEN 325 MG PO TABS: 650.0000 mg | ORAL_TABLET | Freq: Once | ORAL | Status: DC

## 2020-03-15 MED ORDER — LORATADINE 10 MG PO TABS
ORAL_TABLET | ORAL | Status: AC
  Administered 2020-03-15: 10 mg via ORAL
  Filled 2020-03-15: qty 1

## 2020-03-15 NOTE — Discharge Instructions (Signed)

## 2020-03-18 ENCOUNTER — Encounter (HOSPITAL_COMMUNITY)
Admission: RE | Admit: 2020-03-18 | Discharge: 2020-03-18 | Disposition: A | Discharge status: Home / Self Care | Payer: Medicaid Other | Source: Ambulatory Visit | Attending: Hematology | Admitting: Hematology

## 2020-03-18 ENCOUNTER — Other Ambulatory Visit: Payer: Self-pay

## 2020-03-18 DIAGNOSIS — D509 Iron deficiency anemia, unspecified: Secondary | ICD-10-CM | POA: Insufficient documentation

## 2020-03-18 DIAGNOSIS — O99013 Anemia complicating pregnancy, third trimester: Secondary | ICD-10-CM | POA: Diagnosis present

## 2020-03-18 MED ORDER — LORATADINE 10 MG PO TABS: 10.0000 mg | ORAL_TABLET | Freq: Every day | ORAL | Status: DC

## 2020-03-18 MED ORDER — ACETAMINOPHEN 325 MG PO TABS
ORAL_TABLET | ORAL | Status: AC
  Administered 2020-03-18: 650 mg via ORAL
  Filled 2020-03-18: qty 2

## 2020-03-18 MED ORDER — SODIUM CHLORIDE 0.9 % IV SOLN
200.0000 mg | INTRAVENOUS | Status: DC
  Administered 2020-03-18: 200 mg via INTRAVENOUS
  Filled 2020-03-18: qty 10

## 2020-03-18 MED ORDER — ACETAMINOPHEN 325 MG PO TABS: 650.0000 mg | ORAL_TABLET | Freq: Once | ORAL | Status: AC

## 2020-03-18 MED ORDER — LORATADINE 10 MG PO TABS
ORAL_TABLET | ORAL | Status: AC
  Administered 2020-03-18: 10 mg via ORAL
  Filled 2020-03-18: qty 1

## 2020-03-22 ENCOUNTER — Ambulatory Visit (HOSPITAL_COMMUNITY)
Admission: RE | Admit: 2020-03-22 | Discharge: 2020-03-22 | Disposition: A | Discharge status: Home / Self Care | Payer: Medicaid Other | Source: Ambulatory Visit | Attending: Hematology | Admitting: Hematology

## 2020-03-22 ENCOUNTER — Other Ambulatory Visit: Payer: Self-pay

## 2020-03-22 DIAGNOSIS — O99013 Anemia complicating pregnancy, third trimester: Secondary | ICD-10-CM | POA: Diagnosis not present

## 2020-03-22 DIAGNOSIS — D509 Iron deficiency anemia, unspecified: Secondary | ICD-10-CM

## 2020-03-22 MED ORDER — SODIUM CHLORIDE 0.9 % IV SOLN
200.0000 mg | INTRAVENOUS | Status: AC
  Administered 2020-03-22: 200 mg via INTRAVENOUS
  Filled 2020-03-22: qty 10

## 2020-03-22 MED ORDER — ACETAMINOPHEN 325 MG PO TABS
ORAL_TABLET | ORAL | Status: AC
  Filled 2020-03-22: qty 2

## 2020-03-22 MED ORDER — ACETAMINOPHEN 325 MG PO TABS
650.0000 mg | ORAL_TABLET | Freq: Once | ORAL | Status: AC
  Administered 2020-03-22: 650 mg via ORAL

## 2020-03-22 MED ORDER — LORATADINE 10 MG PO TABS
ORAL_TABLET | ORAL | Status: AC
  Filled 2020-03-22: qty 1

## 2020-03-22 MED ORDER — LORATADINE 10 MG PO TABS
10.0000 mg | ORAL_TABLET | Freq: Every day | ORAL | Status: DC
  Administered 2020-03-22: 10 mg via ORAL

## 2020-03-31 LAB — OB RESULTS CONSOLE GBS: GBS: NEGATIVE

## 2020-04-02 ENCOUNTER — Other Ambulatory Visit (HOSPITAL_COMMUNITY): Payer: Self-pay | Admitting: *Deleted

## 2020-04-05 ENCOUNTER — Other Ambulatory Visit: Payer: Self-pay

## 2020-04-05 ENCOUNTER — Encounter (HOSPITAL_COMMUNITY)
Admission: RE | Admit: 2020-04-05 | Discharge: 2020-04-05 | Disposition: A | Discharge status: Home / Self Care | Payer: Medicaid Other | Source: Ambulatory Visit | Attending: Obstetrics and Gynecology | Admitting: Obstetrics and Gynecology

## 2020-04-05 DIAGNOSIS — O99013 Anemia complicating pregnancy, third trimester: Secondary | ICD-10-CM | POA: Diagnosis not present

## 2020-04-05 MED ORDER — SODIUM CHLORIDE 0.9 % IV SOLN
510.0000 mg | INTRAVENOUS | Status: DC
  Administered 2020-04-05: 510 mg via INTRAVENOUS
  Filled 2020-04-05: qty 17

## 2020-04-05 NOTE — Discharge Instructions (Signed)

## 2020-04-06 ENCOUNTER — Other Ambulatory Visit: Payer: Self-pay | Admitting: *Deleted

## 2020-04-06 DIAGNOSIS — D509 Iron deficiency anemia, unspecified: Secondary | ICD-10-CM

## 2020-04-07 ENCOUNTER — Other Ambulatory Visit: Payer: Self-pay

## 2020-04-07 ENCOUNTER — Inpatient Hospital Stay: Payer: Medicaid Other | Attending: Hematology

## 2020-04-07 ENCOUNTER — Inpatient Hospital Stay (HOSPITAL_BASED_OUTPATIENT_CLINIC_OR_DEPARTMENT_OTHER): Payer: Medicaid Other | Admitting: Hematology

## 2020-04-07 DIAGNOSIS — O99013 Anemia complicating pregnancy, third trimester: Secondary | ICD-10-CM | POA: Insufficient documentation

## 2020-04-07 DIAGNOSIS — O99283 Endocrine, nutritional and metabolic diseases complicating pregnancy, third trimester: Secondary | ICD-10-CM | POA: Diagnosis not present

## 2020-04-07 DIAGNOSIS — Z3A32 32 weeks gestation of pregnancy: Secondary | ICD-10-CM | POA: Insufficient documentation

## 2020-04-07 DIAGNOSIS — O26893 Other specified pregnancy related conditions, third trimester: Secondary | ICD-10-CM | POA: Diagnosis not present

## 2020-04-07 DIAGNOSIS — E538 Deficiency of other specified B group vitamins: Secondary | ICD-10-CM | POA: Insufficient documentation

## 2020-04-07 DIAGNOSIS — D509 Iron deficiency anemia, unspecified: Secondary | ICD-10-CM | POA: Diagnosis present

## 2020-04-07 DIAGNOSIS — D582 Other hemoglobinopathies: Secondary | ICD-10-CM

## 2020-04-07 LAB — CMP (CANCER CENTER ONLY)
ALT: 20 U/L (ref 0–44)
AST: 24 U/L (ref 15–41)
Albumin: 3.1 g/dL — ABNORMAL LOW (ref 3.5–5.0)
Alkaline Phosphatase: 117 U/L (ref 38–126)
Anion gap: 6 (ref 5–15)
BUN: 7 mg/dL (ref 6–20)
CO2: 24 mmol/L (ref 22–32)
Calcium: 9.4 mg/dL (ref 8.9–10.3)
Chloride: 106 mmol/L (ref 98–111)
Creatinine: 0.65 mg/dL (ref 0.44–1.00)
GFR, Est AFR Am: >60 mL/min (ref >60)
GFR, Estimated: >60 mL/min (ref >60)
Glucose, Bld: 95 mg/dL (ref 70–99)
Potassium: 4.3 mmol/L (ref 3.5–5.1)
Sodium: 136 mmol/L (ref 135–145)
Total Bilirubin: 0.6 mg/dL (ref 0.3–1.2)
Total Protein: 7.1 g/dL (ref 6.5–8.1)

## 2020-04-07 LAB — CBC WITH DIFFERENTIAL (CANCER CENTER ONLY)
Abs Immature Granulocytes: 0.04 10*3/uL (ref 0.00–0.07)
Basophils Absolute: 0 10*3/uL (ref 0.0–0.1)
Basophils Relative: 0 %
Eosinophils Absolute: 0.3 10*3/uL (ref 0.0–0.5)
Eosinophils Relative: 4 %
HCT: 28.5 % — ABNORMAL LOW (ref 36.0–46.0)
Hemoglobin: 9.3 g/dL — ABNORMAL LOW (ref 12.0–15.0)
Immature Granulocytes: 1 %
Lymphocytes Relative: 27 %
Lymphs Abs: 1.9 10*3/uL (ref 0.7–4.0)
MCH: 20.8 pg — ABNORMAL LOW (ref 26.0–34.0)
MCHC: 32.6 g/dL (ref 30.0–36.0)
MCV: 63.6 fL — ABNORMAL LOW (ref 80.0–100.0)
Monocytes Absolute: 0.4 10*3/uL (ref 0.1–1.0)
Monocytes Relative: 6 %
Neutro Abs: 4.4 10*3/uL (ref 1.7–7.7)
Neutrophils Relative %: 62 %
Platelet Count: 299 10*3/uL (ref 150–400)
RBC: 4.48 MIL/uL (ref 3.87–5.11)
RDW: 19.9 % — ABNORMAL HIGH (ref 11.5–15.5)
WBC Count: 7.1 10*3/uL (ref 4.0–10.5)
nRBC: 0.7 % — ABNORMAL HIGH (ref 0.0–0.2)

## 2020-04-07 LAB — IRON AND TIBC
Iron: 521 ug/dL — ABNORMAL HIGH (ref 41–142)
Saturation Ratios: 149 % — ABNORMAL HIGH (ref 21–57)
TIBC: 350 ug/dL (ref 236–444)
UIBC: UNDETERMINED ug/dL (ref 120–384)

## 2020-04-07 LAB — SAMPLE TO BLOOD BANK

## 2020-04-07 LAB — RETICULOCYTES
Immature Retic Fract: 35.2 % — ABNORMAL HIGH (ref 2.3–15.9)
RBC.: 4.44 MIL/uL (ref 3.87–5.11)
Retic Count, Absolute: 112.8 10*3/uL (ref 19.0–186.0)
Retic Ct Pct: 2.5 % (ref 0.4–3.1)

## 2020-04-07 LAB — FERRITIN: Ferritin: 392 ng/mL — ABNORMAL HIGH (ref 11–307)

## 2020-04-07 MED ORDER — B-12 1000 MCG SL SUBL: 1000.0000 ug | SUBLINGUAL_TABLET | Freq: Every day | SUBLINGUAL | 3 refills | Status: DC

## 2020-04-07 NOTE — Progress Notes (Signed)
HEMATOLOGY/ONCOLOGY CLINIC NOTE  Date of Service: 04/07/2020  Patient Care Team: Patient, No Pcp Per as PCP - General (General Practice)  CHIEF COMPLAINTS/PURPOSE OF CONSULTATION:  Anemia in pregnancy  HISTORY OF PRESENTING ILLNESS:  Brittany Wilson is a wonderful 34 y.o. female who has been referred to us by Dr. Reina Wilson for evaluation and management of anemia in pregnancy. We are joined today by her aunt, Brittany Wilson, via phone. The pt reports that she is doing well overall.   The pt reports that she is currently [redacted] weeks pregnant with her fourth child. She has three boys; ages 4210, 478, and 2. She has not required blood transfusions or iron infusions with her previous pregnancies. Pt was given PO Iron to take during her last pregnancy and was able to deliver via C-section. Pt is currently only taking prenatal vitamins and Tylenol. Pt was not told to take PO Iron during this pregnancy. She is planning on having an elective C-section with this pregnancy. She will get a tubal ligation after birth. Pt has been told that that her oldest two children have anemia and iron deficiency.   Most recent lab results (02/03/2020) of CBC is as follows: all values are WNL except for Hgb at 8.9, HCT at 28.0, MCV at 66, MCH at 21.0, RDW at 17.1, Eos Abs at 0.07K.  On review of systems, pt reports lightheadedness, dizziness, fatigue, leg swelling and denies abnormal bleeding, abdominal pain and any other symptoms.   On PMHx the pt reports C-section.  On Family Hx the pt reports that her husband's father has Hemoglobin E disease and her nieces have thalassemia.   INTERVAL HISTORY: I connected with  Brittany Wilson on 04/07/20 by telephone and verified that I am speaking with the correct person using two identifiers.   I discussed the limitations of evaluation and management by telemedicine. The patient expressed understanding and agreed to proceed.  Other persons participating in the visit and their role in the  encounter:       -Brittany Wilson, Medical Scribe  Patient's location: Home [facility]  Brittany Wilson is a wonderful 34 y.o. female who is here for evaluation and management of anemia in pregnancy. The patient's last visit with us was on 03/10/2020. The pt reports that she is doing well overall.  The pt reports that she tolerated the three doses of Venofer well, but has been scheduled for a fourth dose of IV Iron on 04/12/2020. She notes that Brittany Wilson rechecked her labs on 03/31/20 and decided that she needed another IV Iron infusion. Pt has felt better since receiving her IV Iron infusions.   Lab results today (04/07/20) of CBC w/diff and CMP is as follows: all values are WNL except for Hgb at 9.3, HCT at 28.5, MCV at 63.6, MCH at 20.8, RDW at 19.9, nRBC at 0.7, Albumin at 3.1. 04/07/2020 Reticulocytes is as follows: Retic Ct Pct at 2.5, RBC at 4.44, Retic Ct Abs at 112.8, Immature Retic Fract at 35.2. 04/07/2020 Iron Panel is as follows: Iron at 521, TIBC at 350, Sat Ratios at 149, UIBC - "Unable to calculate" 04/07/2020 Ferritin at 392  On review of systems, pt denies new fatigue and any other symptoms.   MEDICAL HISTORY:  Past Medical History:  Diagnosis Date  . Failed external cephalic version 5/62/13088/14/2019  . Malpresentation before onset of labor, fetus 1 03/27/2018  . Medical history non-contributory     SURGICAL HISTORY: Past Surgical  History:  Procedure Laterality Date  . CESAREAN SECTION N/A 03/27/2018   Procedure: CESAREAN SECTION;  Surgeon: Brittany Rein, Brittany Wilson;  Location: [facility];  Service: Obstetrics;  Laterality: N/A;    SOCIAL HISTORY: Social History   Socioeconomic History  . Marital status: Single    Spouse name: Not on file  . Number of children: Not on file  . Years of education: Not on file  . Highest education level: Not on file  Occupational History  . Not on file  Tobacco Use  . Smoking status: Never Smoker    . Smokeless tobacco: Never Used  Vaping Use  . Vaping Use: Never used  Substance and Sexual Activity  . Alcohol use: No  . Drug use: No  . Sexual activity: Yes  Other Topics Concern  . Not on file  Social History Narrative  . Not on file   Social Determinants of Health   Financial Resource Strain:   . Difficulty of Paying Living Expenses: Not on file  Food Insecurity:   . Worried About Programme researcher, broadcasting/film/video in the Last Year: Not on file  . Ran Out of Food in the Last Year: Not on file  Transportation Needs:   . Lack of Transportation (Medical): Not on file  . Lack of Transportation (Non-Medical): Not on file  Physical Activity:   . Days of Exercise per Week: Not on file  . Minutes of Exercise per Session: Not on file  Stress:   . Feeling of Stress : Not on file  Social Connections:   . Frequency of Communication with Friends and Family: Not on file  . Frequency of Social Gatherings with Friends and Family: Not on file  . Attends Religious Services: Not on file  . Active Member of Clubs or Organizations: Not on file  . Attends Banker Meetings: Not on file  . Marital Status: Not on file  Intimate Partner Violence:   . Fear of Current or Ex-Partner: Not on file  . Emotionally Abused: Not on file  . Physically Abused: Not on file  . Sexually Abused: Not on file    FAMILY HISTORY: Family History  Problem Relation Age of Onset  . Liver cancer Maternal Grandfather     ALLERGIES:  is allergic to shellfish allergy.  MEDICATIONS:  Current Outpatient Medications  Medication Sig Dispense Refill  . Cyanocobalamin (B-12) 1000 MCG SUBL Place 1,000 mcg under the tongue daily. 30 tablet 3  . iron polysaccharides (NIFEREX) 150 MG capsule Take 1 capsule (150 mg total) by mouth 2 (two) times daily. 60 capsule 3  . Prenatal Vit-Fe Fumarate-FA (PRENATAL VITAMINS PO) Take 1 capsule by mouth daily.     No current facility-administered medications for this visit.     REVIEW OF SYSTEMS:   A 10+ POINT REVIEW OF SYSTEMS WAS OBTAINED including neurology, dermatology, psychiatry, cardiac, respiratory, lymph, extremities, GI, GU, Musculoskeletal, constitutional, breasts, reproductive, HEENT.  All pertinent positives are noted in the HPI.  All others are negative.   PHYSICAL EXAMINATION: ECOG PERFORMANCE STATUS: 0 - Asymptomatic  Telehealth visit  LABORATORY DATA:  I have reviewed the data as listed  . CBC Latest Ref Rng & Units 04/07/2020 03/03/2020 03/03/2020  WBC 4.0 - 10.5 K/uL 7.1 8.3 -  Hemoglobin 12.0 - 15.0 g/dL 3.5(K) 0.9(F) -  Hematocrit 36 - 46 % 28.5(L) 29.0(L) 26.9(L)  Platelets 150 - 400 K/uL 299 347 -    . CMP Latest Ref Rng & Units 04/07/2020 03/03/2020  Glucose 70 - 99 mg/dL 95 75  BUN 6 - 20 mg/dL 7 7  Creatinine 4.00 - 1.00 mg/dL 8.67 6.19  Sodium 509 - 145 mmol/L 136 136  Potassium 3.5 - 5.1 mmol/L 4.3 3.9  Chloride 98 - 111 mmol/L 106 106  CO2 22 - 32 mmol/L 24 22  Calcium 8.9 - 10.3 mg/dL 9.4 9.0  Total Protein 6.5 - 8.1 g/dL 7.1 7.3  Total Bilirubin 0.3 - 1.2 mg/dL 0.6 0.5  Alkaline Phos 38 - 126 U/L 117 87  AST 15 - 41 U/L 24 14(L)  ALT 0 - 44 U/L 20 14   . Lab Results  Component Value Date   IRON 521 (H) 04/07/2020   TIBC 350 04/07/2020   IRONPCTSAT 149 (H) 04/07/2020   (Iron and TIBC)  Lab Results  Component Value Date   FERRITIN 392 (H) 04/07/2020    Component     Latest Ref Rng & Units 03/03/2020  Folate, Hemolysate     Not Estab. ng/mL 249.0  HCT     34.0 - 46.6 % 29.0 (L)  Folate, RBC     >498 ng/mL 859  Vitamin B12     180 - 914 pg/mL 195   Component     Latest Ref Rng & Units 03/03/2020  HGB F     0.0 - 2.0 % 5.4 (H)  Hgb A     96.4 - 98.8 % 0.0 (L)  HGB A2     1.8 - 3.2 % Comment  HGB S     0.0 % 0.0  HGB C     0.0 % 0.0  Hgb E     0.0 % 90.3 (H)  HGB VARIANT     0.0 % 0.0  Final Interpretation:      Comment   Hgb Fractionation by HPLC      No reference range information  available      Resulting Lab: [facility]      Comments:           (NOTE)           Hemoglobin pattern and concentrations are consistent with a           homozygous E patient. Suggest clinical and hematologic            correlation.           Homozygous E Interpretation Ranges           Hgb F   0.0 - 15.0%           Hgb E        >80.0%           Hgb A2       > 5.0%           Performed At: [facility]...  Alpha-Thalassemia Comment:   Comment: (NOTE)  Test: Alpha-Thalassemia, DNA Analysis  Result:    Negative, alpha alpha/alpha alpha     RADIOGRAPHIC STUDIES: I have personally reviewed the radiological images as listed and agreed with the findings in the report. No results found.  ASSESSMENT & PLAN:   34 yo female with   1) Microcytic Anemia  hgb @ 9.  Multifactorial due to Iron deficiency + newly diagnosed Homozygous HgB E  Additional elements causing anemia - hemodlution in 3rd trimester of pregnancy.  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 She will have some anemia at baseline.  -Advised pt that her Hgb currently appears to be close to her baseline. No indication for additional IV Iron infusions at this time.  -Advised pt that there are no contraindications to her scheduled C-section from our standpoint. Advised pt that she may need 1 unit of PRBC if Hgb <9 at the time of surgery.  -Continue 1000 mcg Vitamin B12 SL daily -Continue prenatal vitamins as prescribed  FOLLOW UP: RTC with Dr Candise Che with labs in 3 months   The total time spent in the appt was 20 minutes and more than 50% was on counseling and direct patient cares.  All of the patient's  questions were answered with apparent satisfaction. The patient knows to call the clinic with any problems, questions or concerns.    Wyvonnia Lora MD MS AAHIVMS [facility] Hematology/Oncology Physician [facility]  (Office):       [phone] (Work cell):  [phone] (Fax):           [phone]  04/07/2020 4:38 PM  I, Brittany Herter, am acting as a scribe for Dr. Wyvonnia Lora.   .I have reviewed the above documentation for accuracy and completeness, and I agree with the above. Johney Maine Brittany Wilson

## 2020-04-09 LAB — HGB FRACTIONATION BY HPLC
Hgb A: 0 % — ABNORMAL LOW (ref 96.4–98.8)
Hgb C: 0 %
Hgb E: 89.6 % — ABNORMAL HIGH
Hgb F: 5.6 % — ABNORMAL HIGH (ref 0.0–2.0)
Hgb S: 0 %
Hgb Variant: 0 %

## 2020-04-09 LAB — HGB FRACTIONATION CASCADE: Hgb A2: 4.8 % — ABNORMAL HIGH (ref 1.8–3.2)

## 2020-04-12 ENCOUNTER — Encounter (HOSPITAL_COMMUNITY)
Admission: RE | Admit: 2020-04-12 | Discharge: 2020-04-12 | Disposition: A | Discharge status: Home / Self Care | Payer: Medicaid Other | Source: Ambulatory Visit | Attending: Hematology | Admitting: Hematology

## 2020-04-12 ENCOUNTER — Encounter (HOSPITAL_COMMUNITY): Payer: Medicaid Other

## 2020-04-12 ENCOUNTER — Other Ambulatory Visit: Payer: Self-pay

## 2020-04-12 DIAGNOSIS — O99013 Anemia complicating pregnancy, third trimester: Secondary | ICD-10-CM | POA: Diagnosis not present

## 2020-04-12 MED ORDER — SODIUM CHLORIDE 0.9 % IV SOLN
510.0000 mg | INTRAVENOUS | Status: DC
  Administered 2020-04-12: 510 mg via INTRAVENOUS
  Filled 2020-04-12: qty 17

## 2020-04-14 ENCOUNTER — Encounter (HOSPITAL_COMMUNITY): Payer: Self-pay

## 2020-04-14 ENCOUNTER — Encounter (HOSPITAL_COMMUNITY): Payer: Self-pay | Admitting: *Deleted

## 2020-04-14 ENCOUNTER — Telehealth: Payer: Self-pay | Admitting: Hematology

## 2020-04-14 ENCOUNTER — Telehealth (HOSPITAL_COMMUNITY): Payer: Self-pay | Admitting: *Deleted

## 2020-04-14 NOTE — Patient Instructions (Signed)
[name]  04/14/2020   Your procedure is scheduled on:  04/20/2020  Arrive at 0930 at Entrance C on [employer] at [facility]  and [employer]. You are invited to use the FREE valet parking or use the Visitor's parking deck.  Pick up the phone at the desk and dial [phone].  Call this number if you have problems the morning of surgery: [phone]  Remember:   Do not eat food:(After Midnight) Desps de medianoche.  Do not drink clear liquids: (After Midnight) Desps de medianoche.  Take these medicines the morning of surgery with A SIP OF WATER:  none   Do not wear jewelry, make-up or nail polish.  Do not wear lotions, powders, or perfumes. Do not wear deodorant.  Do not shave 48 hours prior to surgery.  Do not bring valuables to the hospital.  [facility] is not   responsible for any belongings or valuables brought to the hospital.  Contacts, dentures or bridgework may not be worn into surgery.  Leave suitcase in the car. After surgery it may be brought to your room.  For patients admitted to the hospital, checkout time is 11:00 AM the day of              discharge.      Please read over the following fact sheets that you were given:     Preparing for Surgery

## 2020-04-14 NOTE — Pre-Procedure Instructions (Signed)
[id number] interpreter number

## 2020-04-14 NOTE — Telephone Encounter (Signed)
Scheduled appt per 8/31 sch msg - pt is aware of appt  ° °

## 2020-04-14 NOTE — Telephone Encounter (Signed)
Preadmission screen  

## 2020-04-18 ENCOUNTER — Other Ambulatory Visit: Payer: Self-pay

## 2020-04-18 ENCOUNTER — Other Ambulatory Visit (HOSPITAL_COMMUNITY)
Admission: RE | Admit: 2020-04-18 | Discharge: 2020-04-18 | Disposition: A | Discharge status: Home / Self Care | Payer: Medicaid Other | Source: Ambulatory Visit | Attending: Obstetrics and Gynecology | Admitting: Obstetrics and Gynecology

## 2020-04-18 DIAGNOSIS — Z01812 Encounter for preprocedural laboratory examination: Secondary | ICD-10-CM | POA: Insufficient documentation

## 2020-04-18 DIAGNOSIS — Z20822 Contact with and (suspected) exposure to covid-19: Secondary | ICD-10-CM | POA: Diagnosis not present

## 2020-04-18 LAB — RPR: RPR Ser Ql: NONREACTIVE

## 2020-04-18 LAB — SARS CORONAVIRUS 2 (TAT 6-24 HRS): SARS Coronavirus 2: NEGATIVE

## 2020-04-18 LAB — CBC
HCT: 29.3 % — ABNORMAL LOW (ref 36.0–46.0)
Hemoglobin: 9.6 g/dL — ABNORMAL LOW (ref 12.0–15.0)
MCH: 21.5 pg — ABNORMAL LOW (ref 26.0–34.0)
MCHC: 32.8 g/dL (ref 30.0–36.0)
MCV: 65.7 fL — ABNORMAL LOW (ref 80.0–100.0)
Platelets: 243 10*3/uL (ref 150–400)
RBC: 4.46 MIL/uL (ref 3.87–5.11)
RDW: 20.6 % — ABNORMAL HIGH (ref 11.5–15.5)
WBC: 8.3 10*3/uL (ref 4.0–10.5)
nRBC: 0.4 % — ABNORMAL HIGH (ref 0.0–0.2)

## 2020-04-18 NOTE — MAU Note (Signed)
Pt here for covid swab and lab draw. Denies symptoms or sick contacts. Swab collected.  

## 2020-04-19 NOTE — H&P (Signed)
Brittany Wilson is a 64 y.G.[id number]  female presenting for scheduled repeat cesarean section and bilateral tubal ligation. Pt has a history of svd x 2 then a c/s for breech . Pt was counseled to delivery routes and risks and opted to have a repeat c/s with BTL. She is dated per 13week [place]. She had a neg essential panel. She is GBS neg. She received Tdap on 02/03/20. 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 She received fereheme infusions during pregnancy. OB History    Gravida  5   Para  3   Term  3   Preterm      AB  1   Living  3     SAB  1   TAB      Ectopic      Multiple  0   Live Births  3          Past Medical History:  Diagnosis Date  . Failed external cephalic version 4/62/7035  . Malpresentation before onset of labor, fetus 1 03/27/2018  . Medical history non-contributory    Past Surgical History:  Procedure Laterality Date  . CESAREAN SECTION N/A 03/27/2018   Procedure: CESAREAN SECTION;  Surgeon: Sherian Rein, MD;  Location: [facility];  Service: Obstetrics;  Laterality: N/A;   Family History: family history includes Liver cancer in her maternal grandfather. Social History:  reports that she has never smoked. She has never used smokeless tobacco. She reports that she does not drink alcohol and does not use drugs.     Maternal Diabetes: No Genetic Screening: Normal Maternal Ultrasounds/Referrals: Normal Fetal Ultrasounds or other Referrals:  None Maternal Substance Abuse:  No Significant Maternal Medications:  None Significant Maternal Lab Results:  Group B Strep negative Other Comments:  None  Review of Systems  Constitutional: Positive for fatigue. Negative for activity change.  Eyes: Negative for photophobia and visual disturbance.  Respiratory: Negative for chest tightness and shortness of breath.   Cardiovascular: Negative for chest pain, palpitations and leg swelling.  Gastrointestinal: Negative for abdominal pain.   Genitourinary: Positive for pelvic pain. Negative for vaginal bleeding.  Skin: Negative for color change.  Neurological: Negative for light-headedness and headaches.  Psychiatric/Behavioral: Negative for dysphoric mood and sleep disturbance. The patient is not nervous/anxious.    Maternal Medical History:  Reason for admission: Scheduled repeat cesarean section and bilateral tubal ligation  Contractions: Frequency: rare.    Fetal activity: Perceived fetal activity is normal.    Prenatal complications: no prenatal complications Prenatal Complications - Diabetes: none.      unknown if currently breastfeeding. Maternal Exam:  Uterine Assessment: Contraction strength is mild.  Contraction frequency is rare.   Abdomen: Surgical scars: low transverse.   Estimated fetal weight is AGA.   Fetal presentation: vertex  Introitus: Normal vulva. Vulva is negative for condylomata and lesion.  Normal vagina.  Vagina is negative for condylomata.  Pelvis: adequate for delivery.   Cervix: Cervix evaluated by digital exam.     Fetal Exam Fetal Monitor Review: Baseline rate: 145.  Variability: moderate (6-25 bpm).   Pattern: accelerations present and no decelerations.    Fetal State Assessment: Category I - tracings are normal.     Physical Exam Vitals reviewed. Exam conducted with a chaperone present.  Constitutional:      Appearance: Normal appearance. She is normal weight.  Cardiovascular:     Rate and Rhythm: Normal rate.     Pulses: Normal pulses.  Pulmonary:  Effort: Pulmonary effort is normal.  Abdominal:     Tenderness: There is no abdominal tenderness.  Genitourinary:    General: Normal vulva.  Vulva is no lesion.  Musculoskeletal:        General: Normal range of motion.     Cervical back: Normal range of motion and neck supple.  Skin:    General: Skin is warm and dry.  Neurological:     General: No focal deficit present.     Mental Status: She is alert. Mental  status is at baseline. She is disoriented.  Psychiatric:        Mood and Affect: Mood normal.        Behavior: Behavior normal.        Thought Content: Thought content normal.        Judgment: Judgment normal.     Prenatal labs: ABO, Rh: --/--/O POS (09/05 4128) Antibody: NEG (09/05 0953) Rubella:   RPR: NON REACTIVE (09/05 0953)  HBsAg:    HIV:    GBS:     Assessment/Plan: Pt is 34yo [id number] female here for scheduled cesarean section and bilateral tubal ligation - stable -Admit -NPO per ERAS - Covid screen - Anesthesia consult -Plan to review consent and address questions prior to OR    [name] 04/19/2020, 11:10 PM

## 2020-04-20 ENCOUNTER — Encounter (HOSPITAL_COMMUNITY): Payer: Self-pay | Admitting: Obstetrics and Gynecology

## 2020-04-20 ENCOUNTER — Encounter (HOSPITAL_COMMUNITY)
Admission: RE | Disposition: A | Discharge status: Home / Self Care | Payer: Self-pay | Source: Home / Self Care | Attending: Obstetrics and Gynecology

## 2020-04-20 ENCOUNTER — Inpatient Hospital Stay (HOSPITAL_COMMUNITY)
Admission: RE | Admit: 2020-04-20 | Discharge: 2020-04-22 | DRG: 785 | Disposition: A | Discharge status: Home / Self Care | Payer: Medicaid Other | Attending: Obstetrics and Gynecology | Admitting: Obstetrics and Gynecology

## 2020-04-20 ENCOUNTER — Inpatient Hospital Stay (HOSPITAL_COMMUNITY): Payer: Medicaid Other | Admitting: Anesthesiology

## 2020-04-20 ENCOUNTER — Other Ambulatory Visit: Payer: Self-pay

## 2020-04-20 DIAGNOSIS — O34211 Maternal care for low transverse scar from previous cesarean delivery: Principal | ICD-10-CM | POA: Diagnosis present

## 2020-04-20 DIAGNOSIS — O9902 Anemia complicating childbirth: Secondary | ICD-10-CM | POA: Diagnosis present

## 2020-04-20 DIAGNOSIS — Z302 Encounter for sterilization: Secondary | ICD-10-CM | POA: Diagnosis not present

## 2020-04-20 DIAGNOSIS — Z3A39 39 weeks gestation of pregnancy: Secondary | ICD-10-CM

## 2020-04-20 DIAGNOSIS — Z9851 Tubal ligation status: Secondary | ICD-10-CM

## 2020-04-20 DIAGNOSIS — D638 Anemia in other chronic diseases classified elsewhere: Secondary | ICD-10-CM | POA: Diagnosis present

## 2020-04-20 DIAGNOSIS — Z98891 History of uterine scar from previous surgery: Secondary | ICD-10-CM

## 2020-04-20 SURGERY — Surgical Case
Anesthesia: Spinal | Laterality: Bilateral | Wound class: Clean Contaminated

## 2020-04-20 MED ORDER — SODIUM CHLORIDE 0.9 % IR SOLN
Status: DC | PRN
  Administered 2020-04-20: 1000 mL

## 2020-04-20 MED ORDER — POVIDONE-IODINE 10 % EX SWAB: 2.0000 "application " | Freq: Once | CUTANEOUS | Status: DC

## 2020-04-20 MED ORDER — NALBUPHINE HCL 10 MG/ML IJ SOLN: 5.0000 mg | Freq: Once | INTRAMUSCULAR | Status: DC | PRN

## 2020-04-20 MED ORDER — MEPERIDINE HCL 25 MG/ML IJ SOLN: 6.2500 mg | INTRAMUSCULAR | Status: DC | PRN

## 2020-04-20 MED ORDER — ACETAMINOPHEN 500 MG PO TABS
1000.0000 mg | ORAL_TABLET | Freq: Four times a day (QID) | ORAL | Status: DC
  Administered 2020-04-20 – 2020-04-22 (×7): 1000 mg via ORAL
  Filled 2020-04-20 (×7): qty 2

## 2020-04-20 MED ORDER — PHENYLEPHRINE HCL (PRESSORS) 10 MG/ML IV SOLN
INTRAVENOUS | Status: DC | PRN
  Administered 2020-04-20: 80 ug via INTRAVENOUS
  Administered 2020-04-20: 40 ug via INTRAVENOUS
  Administered 2020-04-20: 80 ug via INTRAVENOUS

## 2020-04-20 MED ORDER — ONDANSETRON HCL 4 MG/2ML IJ SOLN
INTRAMUSCULAR | Status: DC | PRN
  Administered 2020-04-20: 4 mg via INTRAVENOUS

## 2020-04-20 MED ORDER — NALOXONE HCL 0.4 MG/ML IJ SOLN: 0.4000 mg | INTRAMUSCULAR | Status: DC | PRN

## 2020-04-20 MED ORDER — ONDANSETRON HCL 4 MG/2ML IJ SOLN: 4.0000 mg | Freq: Three times a day (TID) | INTRAMUSCULAR | Status: DC | PRN

## 2020-04-20 MED ORDER — SCOPOLAMINE 1 MG/3DAYS TD PT72
MEDICATED_PATCH | TRANSDERMAL | Status: AC
  Filled 2020-04-20: qty 1

## 2020-04-20 MED ORDER — DEXAMETHASONE SODIUM PHOSPHATE 4 MG/ML IJ SOLN
INTRAMUSCULAR | Status: AC
  Filled 2020-04-20: qty 2

## 2020-04-20 MED ORDER — BUPIVACAINE IN DEXTROSE 0.75-8.25 % IT SOLN
INTRATHECAL | Status: DC | PRN
  Administered 2020-04-20: 1.6 mL via INTRATHECAL

## 2020-04-20 MED ORDER — OXYTOCIN-SODIUM CHLORIDE 30-0.9 UT/500ML-% IV SOLN
INTRAVENOUS | Status: DC | PRN
  Administered 2020-04-20: 300 mL via INTRAVENOUS

## 2020-04-20 MED ORDER — ACETAMINOPHEN 500 MG PO TABS
ORAL_TABLET | ORAL | Status: AC
  Filled 2020-04-20: qty 2

## 2020-04-20 MED ORDER — SOD CITRATE-CITRIC ACID 500-334 MG/5ML PO SOLN
ORAL | Status: AC
  Filled 2020-04-20: qty 30

## 2020-04-20 MED ORDER — GABAPENTIN 100 MG PO CAPS
100.0000 mg | ORAL_CAPSULE | Freq: Two times a day (BID) | ORAL | Status: DC
  Administered 2020-04-20 – 2020-04-22 (×4): 100 mg via ORAL
  Filled 2020-04-20 (×4): qty 1

## 2020-04-20 MED ORDER — SIMETHICONE 80 MG PO CHEW: 80.0000 mg | CHEWABLE_TABLET | ORAL | Status: DC | PRN

## 2020-04-20 MED ORDER — PROMETHAZINE HCL 25 MG/ML IJ SOLN: 6.2500 mg | INTRAMUSCULAR | Status: DC | PRN

## 2020-04-20 MED ORDER — FENTANYL CITRATE (PF) 100 MCG/2ML IJ SOLN
INTRAMUSCULAR | Status: AC
  Filled 2020-04-20: qty 2

## 2020-04-20 MED ORDER — LACTATED RINGERS IV SOLN: INTRAVENOUS | Status: DC | PRN

## 2020-04-20 MED ORDER — BUPIVACAINE HCL 0.25 % IJ SOLN
INTRAMUSCULAR | Status: DC | PRN
  Administered 2020-04-20: 10 mL

## 2020-04-20 MED ORDER — PRENATAL MULTIVITAMIN CH
1.0000 | ORAL_TABLET | Freq: Every day | ORAL | Status: DC
  Administered 2020-04-21 – 2020-04-22 (×2): 1 via ORAL
  Filled 2020-04-20 (×2): qty 1

## 2020-04-20 MED ORDER — SOD CITRATE-CITRIC ACID 500-334 MG/5ML PO SOLN
30.0000 mL | Freq: Once | ORAL | Status: AC
  Administered 2020-04-20: 30 mL via ORAL

## 2020-04-20 MED ORDER — COCONUT OIL OIL: 1.0000 "application " | TOPICAL_OIL | Status: DC | PRN

## 2020-04-20 MED ORDER — BUPIVACAINE HCL (PF) 0.25 % IJ SOLN
INTRAMUSCULAR | Status: AC
  Filled 2020-04-20: qty 10

## 2020-04-20 MED ORDER — CEFAZOLIN SODIUM-DEXTROSE 2-4 GM/100ML-% IV SOLN: 2.0000 g | INTRAVENOUS | Status: DC

## 2020-04-20 MED ORDER — STERILE WATER FOR IRRIGATION IR SOLN
Status: DC | PRN
  Administered 2020-04-20: 1000 mL

## 2020-04-20 MED ORDER — SIMETHICONE 80 MG PO CHEW
80.0000 mg | CHEWABLE_TABLET | ORAL | Status: DC
  Administered 2020-04-20 – 2020-04-22 (×2): 80 mg via ORAL
  Filled 2020-04-20 (×2): qty 1

## 2020-04-20 MED ORDER — TETANUS-DIPHTH-ACELL PERTUSSIS 5-2.5-18.5 LF-MCG/0.5 IM SUSP: 0.5000 mL | Freq: Once | INTRAMUSCULAR | Status: DC

## 2020-04-20 MED ORDER — OXYTOCIN-SODIUM CHLORIDE 30-0.9 UT/500ML-% IV SOLN
INTRAVENOUS | Status: AC
  Filled 2020-04-20: qty 500

## 2020-04-20 MED ORDER — LACTATED RINGERS IV SOLN: INTRAVENOUS | Status: DC

## 2020-04-20 MED ORDER — DEXAMETHASONE SODIUM PHOSPHATE 4 MG/ML IJ SOLN
INTRAMUSCULAR | Status: DC | PRN
  Administered 2020-04-20: 4 mg via INTRAVENOUS

## 2020-04-20 MED ORDER — SIMETHICONE 80 MG PO CHEW
80.0000 mg | CHEWABLE_TABLET | Freq: Three times a day (TID) | ORAL | Status: DC
  Administered 2020-04-20 – 2020-04-22 (×6): 80 mg via ORAL
  Filled 2020-04-20 (×6): qty 1

## 2020-04-20 MED ORDER — SCOPOLAMINE 1 MG/3DAYS TD PT72
1.0000 | MEDICATED_PATCH | Freq: Once | TRANSDERMAL | Status: DC
  Administered 2020-04-20: 1.5 mg via TRANSDERMAL

## 2020-04-20 MED ORDER — PHENYLEPHRINE 40 MCG/ML (10ML) SYRINGE FOR IV PUSH (FOR BLOOD PRESSURE SUPPORT)
PREFILLED_SYRINGE | INTRAVENOUS | Status: AC
  Filled 2020-04-20: qty 10

## 2020-04-20 MED ORDER — CEFAZOLIN SODIUM-DEXTROSE 2-4 GM/100ML-% IV SOLN
INTRAVENOUS | Status: AC
  Filled 2020-04-20: qty 100

## 2020-04-20 MED ORDER — CEFAZOLIN SODIUM-DEXTROSE 2-3 GM-%(50ML) IV SOLR
INTRAVENOUS | Status: DC | PRN
  Administered 2020-04-20: 2 g via INTRAVENOUS

## 2020-04-20 MED ORDER — DIPHENHYDRAMINE HCL 50 MG/ML IJ SOLN: 12.5000 mg | INTRAMUSCULAR | Status: DC | PRN

## 2020-04-20 MED ORDER — METOCLOPRAMIDE HCL 5 MG/ML IJ SOLN
INTRAMUSCULAR | Status: AC
  Filled 2020-04-20: qty 2

## 2020-04-20 MED ORDER — CHLORHEXIDINE GLUCONATE 0.12 % MT SOLN
15.0000 mL | Freq: Once | OROMUCOSAL | Status: AC
  Administered 2020-04-20: 15 mL via OROMUCOSAL

## 2020-04-20 MED ORDER — NALOXONE HCL 4 MG/10ML IJ SOLN
1.0000 ug/kg/h | INTRAVENOUS | Status: DC | PRN
  Filled 2020-04-20: qty 5

## 2020-04-20 MED ORDER — ACETAMINOPHEN 500 MG PO TABS: 1000.0000 mg | ORAL_TABLET | Freq: Four times a day (QID) | ORAL | Status: DC

## 2020-04-20 MED ORDER — TRIAMCINOLONE ACETONIDE 40 MG/ML IJ SUSP: 40.0000 mg | Freq: Once | INTRAMUSCULAR | Status: DC

## 2020-04-20 MED ORDER — NALBUPHINE HCL 10 MG/ML IJ SOLN: 5.0000 mg | INTRAMUSCULAR | Status: DC | PRN

## 2020-04-20 MED ORDER — MORPHINE SULFATE (PF) 0.5 MG/ML IJ SOLN
INTRAMUSCULAR | Status: AC
Start: 2020-04-20 — End: ?
  Filled 2020-04-20: qty 10

## 2020-04-20 MED ORDER — FENTANYL CITRATE (PF) 100 MCG/2ML IJ SOLN: 25.0000 ug | INTRAMUSCULAR | Status: DC | PRN

## 2020-04-20 MED ORDER — SENNOSIDES-DOCUSATE SODIUM 8.6-50 MG PO TABS
2.0000 | ORAL_TABLET | ORAL | Status: DC
  Administered 2020-04-20: 2 via ORAL
  Filled 2020-04-20 (×2): qty 2

## 2020-04-20 MED ORDER — SCOPOLAMINE 1 MG/3DAYS TD PT72: 1.0000 | MEDICATED_PATCH | Freq: Once | TRANSDERMAL | Status: DC

## 2020-04-20 MED ORDER — FERROUS SULFATE 325 (65 FE) MG PO TABS
325.0000 mg | ORAL_TABLET | Freq: Two times a day (BID) | ORAL | Status: DC
  Administered 2020-04-20 – 2020-04-22 (×4): 325 mg via ORAL
  Filled 2020-04-20 (×4): qty 1

## 2020-04-20 MED ORDER — CHLORHEXIDINE GLUCONATE 0.12 % MT SOLN
OROMUCOSAL | Status: AC
  Filled 2020-04-20: qty 15

## 2020-04-20 MED ORDER — DIPHENHYDRAMINE HCL 25 MG PO CAPS
25.0000 mg | ORAL_CAPSULE | ORAL | Status: DC | PRN
  Administered 2020-04-20: 25 mg via ORAL
  Filled 2020-04-20: qty 1

## 2020-04-20 MED ORDER — KETOROLAC TROMETHAMINE 30 MG/ML IJ SOLN: 30.0000 mg | Freq: Four times a day (QID) | INTRAMUSCULAR | Status: AC | PRN

## 2020-04-20 MED ORDER — OXYTOCIN-SODIUM CHLORIDE 30-0.9 UT/500ML-% IV SOLN: 2.5000 [IU]/h | INTRAVENOUS | Status: AC

## 2020-04-20 MED ORDER — TRIAMCINOLONE ACETONIDE 40 MG/ML IJ SUSP
INTRAMUSCULAR | Status: AC
  Filled 2020-04-20: qty 1

## 2020-04-20 MED ORDER — ZOLPIDEM TARTRATE 5 MG PO TABS: 5.0000 mg | ORAL_TABLET | Freq: Every evening | ORAL | Status: DC | PRN

## 2020-04-20 MED ORDER — MORPHINE SULFATE (PF) 0.5 MG/ML IJ SOLN
INTRAMUSCULAR | Status: DC | PRN
Start: 2020-04-20 — End: 2020-04-20
  Administered 2020-04-20: 150 ug via INTRATHECAL

## 2020-04-20 MED ORDER — MENTHOL 3 MG MT LOZG: 1.0000 | LOZENGE | OROMUCOSAL | Status: DC | PRN

## 2020-04-20 MED ORDER — DIPHENHYDRAMINE HCL 25 MG PO CAPS: 25.0000 mg | ORAL_CAPSULE | Freq: Four times a day (QID) | ORAL | Status: DC | PRN

## 2020-04-20 MED ORDER — ONDANSETRON HCL 4 MG/2ML IJ SOLN
INTRAMUSCULAR | Status: AC
  Filled 2020-04-20: qty 2

## 2020-04-20 MED ORDER — KETOROLAC TROMETHAMINE 30 MG/ML IJ SOLN
30.0000 mg | Freq: Four times a day (QID) | INTRAMUSCULAR | Status: AC | PRN
  Administered 2020-04-20 – 2020-04-21 (×3): 30 mg via INTRAVENOUS
  Filled 2020-04-20 (×2): qty 1

## 2020-04-20 MED ORDER — FENTANYL CITRATE (PF) 100 MCG/2ML IJ SOLN
INTRAMUSCULAR | Status: DC | PRN
Start: 2020-04-20 — End: 2020-04-20
  Administered 2020-04-20: 15 ug via INTRATHECAL

## 2020-04-20 MED ORDER — TRIAMCINOLONE ACETONIDE 40 MG/ML IJ SUSP
INTRAMUSCULAR | Status: DC | PRN
  Administered 2020-04-20: 40 mg via INTRAMUSCULAR

## 2020-04-20 MED ORDER — PHENYLEPHRINE HCL-NACL 20-0.9 MG/250ML-% IV SOLN
INTRAVENOUS | Status: DC | PRN
  Administered 2020-04-20: 60 ug/min via INTRAVENOUS

## 2020-04-20 MED ORDER — OXYCODONE HCL 5 MG PO TABS
5.0000 mg | ORAL_TABLET | ORAL | Status: DC | PRN
  Administered 2020-04-21 – 2020-04-22 (×2): 5 mg via ORAL
  Filled 2020-04-20 (×2): qty 1

## 2020-04-20 MED ORDER — ACETAMINOPHEN 500 MG PO TABS
1000.0000 mg | ORAL_TABLET | ORAL | Status: AC
  Administered 2020-04-20: 1000 mg via ORAL

## 2020-04-20 MED ORDER — ORAL CARE MOUTH RINSE: 15.0000 mL | Freq: Once | OROMUCOSAL | Status: AC

## 2020-04-20 MED ORDER — DIBUCAINE (PERIANAL) 1 % EX OINT: 1.0000 "application " | TOPICAL_OINTMENT | CUTANEOUS | Status: DC | PRN

## 2020-04-20 MED ORDER — IBUPROFEN 800 MG PO TABS
800.0000 mg | ORAL_TABLET | Freq: Three times a day (TID) | ORAL | Status: DC
  Administered 2020-04-21 – 2020-04-22 (×3): 800 mg via ORAL
  Filled 2020-04-20 (×3): qty 1

## 2020-04-20 MED ORDER — SODIUM CHLORIDE 0.9% FLUSH: 3.0000 mL | INTRAVENOUS | Status: DC | PRN

## 2020-04-20 MED ORDER — KETOROLAC TROMETHAMINE 30 MG/ML IJ SOLN
INTRAMUSCULAR | Status: AC
  Filled 2020-04-20: qty 1

## 2020-04-20 MED ORDER — WITCH HAZEL-GLYCERIN EX PADS: 1.0000 "application " | MEDICATED_PAD | CUTANEOUS | Status: DC | PRN

## 2020-04-20 MED ORDER — PHENYLEPHRINE HCL-NACL 20-0.9 MG/250ML-% IV SOLN
INTRAVENOUS | Status: AC
  Filled 2020-04-20: qty 250

## 2020-04-20 SURGICAL SUPPLY — 40 items
BENZOIN TINCTURE PRP APPL 2/3 (GAUZE/BANDAGES/DRESSINGS) ×3 IMPLANT
CHLORAPREP W/TINT 26ML (MISCELLANEOUS) ×3 IMPLANT
CLAMP CORD UMBIL (MISCELLANEOUS) IMPLANT
CLOSURE STERI STRIP 1/2 X4 (GAUZE/BANDAGES/DRESSINGS) ×3 IMPLANT
CLOSURE WOUND 1/2 X4 (GAUZE/BANDAGES/DRESSINGS)
CLOTH BEACON ORANGE TIMEOUT ST (SAFETY) ×3 IMPLANT
DRAPE C SECTION CLR SCREEN (DRAPES) ×3 IMPLANT
DRSG OPSITE POSTOP 4X10 (GAUZE/BANDAGES/DRESSINGS) ×3 IMPLANT
ELECT REM PT RETURN 9FT ADLT (ELECTROSURGICAL) ×3
ELECTRODE REM PT RTRN 9FT ADLT (ELECTROSURGICAL) ×1 IMPLANT
EXTRACTOR VACUUM KIWI (MISCELLANEOUS) IMPLANT
GAUZE SPONGE 4X4 12PLY STRL LF (GAUZE/BANDAGES/DRESSINGS) ×6 IMPLANT
GLOVE BIO SURGEON STRL SZ 6.5 (GLOVE) ×2 IMPLANT
GLOVE BIO SURGEONS STRL SZ 6.5 (GLOVE) ×1
GLOVE BIOGEL PI IND STRL 7.0 (GLOVE) ×2 IMPLANT
GLOVE BIOGEL PI INDICATOR 7.0 (GLOVE) ×4
GOWN STRL REUS W/TWL LRG LVL3 (GOWN DISPOSABLE) ×6 IMPLANT
KIT ABG SYR 3ML LUER SLIP (SYRINGE) IMPLANT
NEEDLE HYPO 25X5/8 SAFETYGLIDE (NEEDLE) IMPLANT
NS IRRIG 1000ML POUR BTL (IV SOLUTION) ×3 IMPLANT
PACK C SECTION WH (CUSTOM PROCEDURE TRAY) ×3 IMPLANT
PAD ABD 7.5X8 STRL (GAUZE/BANDAGES/DRESSINGS) ×3 IMPLANT
PAD OB MATERNITY 4.3X12.25 (PERSONAL CARE ITEMS) ×3 IMPLANT
RETRACTOR WND ALEXIS 25 LRG (MISCELLANEOUS) ×1 IMPLANT
RTRCTR C-SECT PINK 25CM LRG (MISCELLANEOUS) IMPLANT
RTRCTR WOUND ALEXIS 25CM LRG (MISCELLANEOUS) ×3
STRIP CLOSURE SKIN 1/2X4 (GAUZE/BANDAGES/DRESSINGS) IMPLANT
SUT CHROMIC 1 CTX 36 (SUTURE) ×6 IMPLANT
SUT PLAIN 0 NONE (SUTURE) IMPLANT
SUT PLAIN 2 0 XLH (SUTURE) ×3 IMPLANT
SUT VIC AB 0 CT1 27 (SUTURE) ×4
SUT VIC AB 0 CT1 27XBRD ANBCTR (SUTURE) ×2 IMPLANT
SUT VIC AB 2-0 CT1 27 (SUTURE) ×2
SUT VIC AB 2-0 CT1 TAPERPNT 27 (SUTURE) ×1 IMPLANT
SUT VIC AB 3-0 CT1 27 (SUTURE)
SUT VIC AB 3-0 CT1 TAPERPNT 27 (SUTURE) IMPLANT
SUT VIC AB 4-0 KS 27 (SUTURE) ×3 IMPLANT
TOWEL OR 17X24 6PK STRL BLUE (TOWEL DISPOSABLE) ×3 IMPLANT
TRAY FOLEY W/BAG SLVR 14FR LF (SET/KITS/TRAYS/PACK) ×3 IMPLANT
WATER STERILE IRR 1000ML POUR (IV SOLUTION) ×3 IMPLANT

## 2020-04-20 NOTE — Op Note (Addendum)
Operative Note    Preoperative Diagnosis 1. IUP at 39+ 2. Elective repeat cesarean section 3. COmplete family/ requests sterilization   Postoperative Diagnosis Same   Procedure: Repeat low transverse cesarean section and bilateral tubal ligation   Surgeon: Britt Bottom DO Assist: Webb Silversmith RNFA  Anesthesia: SPinal  Fluids: EBL: UOP:284ml   Findings: Viable female infant in vertex position, Apgars 9,9, Weight pending. Grossly normal uterus, tubes and ovaries   Specimen: Placenta to L/D; segments of left and right tubes to pathology   Procedure Note  Patient was taken to the operating room where spinal anesthesia was administered. She was prepped and draped in the normal sterile fashion in the dorsal supine position with a leftward tilt. An appropriate time out was performed. An allis clamp test was performed and anesthesia was found to be adequate.  Her previous keloided pfannenstiel skin incision was then removed.. The incision was carried through with the scalpel to the underlying layer of fascia by sharp dissection. The fascia was nicked in the midline and the incision was extended laterally with Mayo scissors. The superior aspect of the incision was grasped kocher clamps and dissected off the underlying rectus muscles. In a similar fashion the inferior aspect was dissected off the rectus muscles. Rectus muscles were separated in the midline and the peritoneal cavity entered bluntly. The peritoneal incision was then extended both superiorly and inferiorly with careful attention to avoid both bowel and bladder. The Alexis self-retaining wound retractor was then placed within the incision and the lower uterine segment exposed. The bladder flap was developed with Metzenbaum scissors and pushed away from the lower uterine segment. The lower uterine segment was then incised in a transverse fashion and the cavity itself entered bluntly. Clear amniotic fluid was noted.   The incision was extended bluntly. The infant's head was then lifted and delivered from the incision after a slight extension of incision was made.  Fetal head delivered easily and body followed.. Bulb suction of mouth and nose were performed. After a minute, the cord was clamped and cut. The infant was handed off to the waiting NICU team. The placenta was then spontaneously expressed from the uterus and the uterus cleared of all clots and debris with moist lap sponge. The uterine incision was then repaired in a single layer with a  running locked layer 0 chromic suture. A slight hematoma on the right of the incision was suture ligated with resolution noted. Next, the left and then right tubes were grasped with a babcock in the mid isthmus region and  a 2-3 cm intervening area suture ligated with the segment in the middle cut. Excellent hemostasis was appreciated. The  ovaries were inspected and found to be grossly normal. The gutters were  cleared of all clots and debris. The uterine incision was inspected and found to be hemostatic. All instruments and sponges as well as the Alexis retractor were then removed from the abdomen. The rectus muscles and peritoneum were then reapproximated with 2-0 Vicryl. The fascia was then closed with 0 Vicryl in a running fashion. Subcutaneous tissue layer was thin so did not need to be reapproximated.  A mixture of 40mg  kenalog with 10cc of 0.25% marcaine was injed under the skin. The skin was closed with a subcuticular stitch of 4-0 Vicryl on a Keith needle and then reinforced with benzoin and Steri-Strips. At the conclusion of the procedure all instruments and sponge counts were correct. Patient was taken to the recovery room in  good condition with her baby accompanying her skin to skin.

## 2020-04-20 NOTE — Transfer of Care (Signed)
Immediate Anesthesia Transfer of Care Note  Patient: Brittany Wilson  Procedure(s) Performed: CESAREAN SECTION WITH BILATERAL TUBAL LIGATION (Bilateral )  Patient Location: PACU  Anesthesia Type:Spinal  Level of Consciousness: awake, alert  and oriented  Airway & Oxygen Therapy: Patient Spontanous Breathing  Post-op Assessment: Report given to RN and Post -op Vital signs reviewed and stable  Post vital signs: Reviewed and stable  Last Vitals:  Vitals Value Taken Time  BP 88/60 04/20/20 1306  Temp    Pulse 71 04/20/20 1309  Resp 14 04/20/20 1309  SpO2 100 % 04/20/20 1309  Vitals shown include unvalidated device data.  Last Pain:  Vitals:   04/20/20 0942  TempSrc: Oral         Complications: No complications documented.

## 2020-04-20 NOTE — Anesthesia Postprocedure Evaluation (Signed)
Anesthesia Post Note  Patient: Brittany Wilson  Procedure(s) Performed: CESAREAN SECTION WITH BILATERAL TUBAL LIGATION (Bilateral )     Patient location during evaluation: PACU Anesthesia Type: Spinal Level of consciousness: oriented and awake and alert Pain management: pain level controlled Vital Signs Assessment: post-procedure vital signs reviewed and stable Respiratory status: spontaneous breathing, respiratory function stable and patient connected to nasal cannula oxygen Cardiovascular status: blood pressure returned to baseline and stable Postop Assessment: no headache, no backache and no apparent nausea or vomiting Anesthetic complications: no   No complications documented.  Last Vitals:  Vitals:   04/20/20 1315 04/20/20 1330  BP: 101/66 92/66  Pulse: 77 63  Resp: 16 (!) 6  Temp:    SpO2: 100% 100%    Last Pain:  Vitals:   04/20/20 1330  TempSrc:   PainSc: 0-No pain   Pain Goal:    LLE Motor Response: Purposeful movement (04/20/20 1330) LLE Sensation: Tingling (04/20/20 1330) RLE Motor Response: Purposeful movement (04/20/20 1330) RLE Sensation: Tingling (04/20/20 1330)     Epidural/Spinal Function Cutaneous sensation: Tingles (04/20/20 1330), Patient able to flex knees: No (04/20/20 1330), Patient able to lift hips off bed: No (04/20/20 1330), Back pain beyond tenderness at insertion site: No (04/20/20 1330), Progressively worsening motor and/or sensory loss: No (04/20/20 1330), Bowel and/or bladder incontinence post epidural: No (04/20/20 1330)  Mellody Dance

## 2020-04-20 NOTE — Anesthesia Preprocedure Evaluation (Addendum)
Anesthesia Evaluation  Patient identified by MRN, date of birth, ID band Patient awake    Reviewed: Allergy & Precautions, H&P , NPO status , Patient's Chart, lab work & pertinent test results, reviewed documented beta blocker date and time   Airway Mallampati: I  TM Distance: >3 FB Neck ROM: full    Dental no notable dental hx.    Pulmonary neg pulmonary ROS,    Pulmonary exam normal breath sounds clear to auscultation       Cardiovascular negative cardio ROS Normal cardiovascular exam Rhythm:regular Rate:Normal     Neuro/Psych negative neurological ROS  negative psych ROS   GI/Hepatic negative GI ROS, Neg liver ROS,   Endo/Other  negative endocrine ROS  Renal/GU negative Renal ROS  negative genitourinary   Musculoskeletal   Abdominal   Peds  Hematology  (+) anemia ,   Anesthesia Other Findings   Reproductive/Obstetrics (+) Pregnancy                            Anesthesia Physical  Anesthesia Plan  ASA: II  Anesthesia Plan: Spinal   Post-op Pain Management:    Induction:   PONV Risk Score and Plan: Scopolamine patch - Pre-op, Ondansetron, Dexamethasone and Treatment may vary due to age or medical condition  Airway Management Planned: Nasal Cannula and Natural Airway  Additional Equipment:   Intra-op Plan:   Post-operative Plan:   Informed Consent: I have reviewed the patients History and Physical, chart, labs and discussed the procedure including the risks, benefits and alternatives for the proposed anesthesia with the patient or authorized representative who has indicated his/her understanding and acceptance.       Plan Discussed with: CRNA, Anesthesiologist and Surgeon  Anesthesia Plan Comments: (  )        Anesthesia Quick Evaluation

## 2020-04-20 NOTE — Interval H&P Note (Signed)
History and Physical Interval Note: Pt seen in preop area. No change from h/p. Confirms desire for repeat c/s and btl Risks reviewed and confirmed consent  To OR when ready  04/20/2020 11:29 AM  Brittany Wilson  has presented today for surgery, with the diagnosis of repeat c-section, sterilization.  The various methods of treatment have been discussed with the patient and family. After consideration of risks, benefits and other options for treatment, the patient has consented to  Procedure(s) with comments: CESAREAN SECTION WITH BILATERAL TUBAL LIGATION (Bilateral) - [name], RNFA as a surgical intervention.  The patient's history has been reviewed, patient examined, no change in status, stable for surgery.  I have reviewed the patient's chart and labs.  Questions were answered to the patient's satisfaction.     [name]

## 2020-04-21 LAB — CBC
HCT: 20.5 % — ABNORMAL LOW (ref 36.0–46.0)
HCT: 26.9 % — ABNORMAL LOW (ref 36.0–46.0)
Hemoglobin: 6.8 g/dL — CL (ref 12.0–15.0)
Hemoglobin: 8.9 g/dL — ABNORMAL LOW (ref 12.0–15.0)
MCH: 21.7 pg — ABNORMAL LOW (ref 26.0–34.0)
MCH: 23.3 pg — ABNORMAL LOW (ref 26.0–34.0)
MCHC: 33.2 g/dL (ref 30.0–36.0)
MCHC: 33.1 g/dL (ref 30.0–36.0)
MCV: 65.3 fL — ABNORMAL LOW (ref 80.0–100.0)
MCV: 70.4 fL — ABNORMAL LOW (ref 80.0–100.0)
Platelets: 190 10*3/uL (ref 150–400)
Platelets: 189 10*3/uL (ref 150–400)
RBC: 3.14 MIL/uL — ABNORMAL LOW (ref 3.87–5.11)
RBC: 3.82 MIL/uL — ABNORMAL LOW (ref 3.87–5.11)
RDW: 20 % — ABNORMAL HIGH (ref 11.5–15.5)
RDW: 23.5 % — ABNORMAL HIGH (ref 11.5–15.5)
WBC: 10.8 10*3/uL — ABNORMAL HIGH (ref 4.0–10.5)
WBC: 10.8 10*3/uL — ABNORMAL HIGH (ref 4.0–10.5)
nRBC: 0.3 % — ABNORMAL HIGH (ref 0.0–0.2)
nRBC: 0.2 % (ref 0.0–0.2)

## 2020-04-21 LAB — PREPARE RBC (CROSSMATCH)

## 2020-04-21 MED ORDER — LACTATED RINGERS IV BOLUS
500.0000 mL | Freq: Once | INTRAVENOUS | Status: AC
  Administered 2020-04-21: 500 mL via INTRAVENOUS

## 2020-04-21 MED ORDER — SODIUM CHLORIDE 0.9% IV SOLUTION: Freq: Once | INTRAVENOUS | Status: AC

## 2020-04-21 NOTE — Progress Notes (Signed)
Patient ID: Brittany Wilson, female   DOB: 07/31/1986, 34 y.o.   MRN: [id number] Called and informed pt complaining of lightheadedness and fatigue.  BP noted at 79-86/53-54, HR 65-69  I ordered a stat cbc.   Hg noted to be 6.8  ( was 9 prior to c/s; received fereheme in pregnancy due to anemia)  Per previous discussion with pt, I ordered two units of prbcs to be transfused.  Spoke to nurse and pt after first unit completed  Pt reports feeling better. .   BP stable at 85/55, HR 66, UOP averaging 130ml/hr  Per nurse:  GEN -  NAD ABD - soft, dressing with no drainage, +bs  Plan: complete second unit of blood           Recheck cbc 2hrs after second unit complete

## 2020-04-21 NOTE — Addendum Note (Signed)
Addendum  created 04/21/20 1820 by Mellody Dance, MD   Child order released for a procedure order, Clinical Note Signed, Intraprocedure Blocks edited

## 2020-04-21 NOTE — Progress Notes (Signed)
CRITICAL VALUE STICKER  CRITICAL VALUE: Hemoglobin 6.8  RECEIVER (on-site recipient of call): Patrica Duel, RN   DATE & TIME NOTIFIED:   MESSENGER (representative from lab): Jodelle Red  MD NOTIFIED: Dr. Mindi Slicker  TIME OF NOTIFICATION: 8250  RESPONSE: Transfuse 2 units RBC, repeat CBC post transfusion (see orders)

## 2020-04-21 NOTE — Progress Notes (Signed)
Subjective: Postpartum Day 1: Cesarean Delivery Patient reports incisional pain and tolerating PO.  Nl lochia.  Feeling better w blood transfusion, just tired  Objective: Vital signs in last 24 hours: Temp:  [97.5 F (36.4 C)-98.8 F (37.1 C)] 98.1 F (36.7 C) (09/08 0635) Pulse Rate:  [63-86] 64 (09/08 0635) Resp:  [6-18] 18 (09/08 0635) BP: (79-104)/(53-72) 84/56 (09/08 0635) SpO2:  [98 %-100 %] 98 % (09/08 0635) Weight:  [59 kg] 59 kg (09/07 0942)  Physical Exam:  General: alert and no distress Lochia: appropriate Uterine Fundus: firm Incision: healing well DVT Evaluation: No evidence of DVT seen on physical exam.  Recent Labs    04/18/20 0953 04/21/20 0320  HGB 9.6* 6.8*  HCT 29.3* 20.5*    Assessment/Plan: Status post Cesarean section. Doing well postoperatively.  Continue current care.  Routine PP care.  Repeat CBC after 2nd unit. Will d/c foley when able to ambulate.     Allyanna Appleman Bovard-Stuckert 04/21/2020, 7:26 AM

## 2020-04-21 NOTE — Anesthesia Procedure Notes (Signed)
Spinal  Patient location during procedure: OR Start time: 04/20/2020 11:40 AM End time: 04/20/2020 11:44 AM Staffing Performed: anesthesiologist  Anesthesiologist: Mellody Dance, MD Preanesthetic Checklist Completed: patient identified, IV checked, risks and benefits discussed, surgical consent, monitors and equipment checked, pre-op evaluation and timeout performed Spinal Block Patient position: sitting Prep: DuraPrep and site prepped and draped Patient monitoring: continuous pulse ox, blood pressure and heart rate Approach: midline Location: L3-4 Injection technique: single-shot Needle Needle type: Pencan  Needle gauge: 24 G Needle length: 9 cm Additional Notes Functioning IV was confirmed and monitors were applied. Sterile prep and drape, including hand hygiene and sterile gloves were used. The patient was positioned and the spine was prepped. The skin was anesthetized with lidocaine.  Free flow of clear CSF was obtained prior to injecting local anesthetic into the CSF. The needle was carefully withdrawn. The patient tolerated the procedure well.

## 2020-04-22 ENCOUNTER — Other Ambulatory Visit: Payer: Self-pay

## 2020-04-22 LAB — TYPE AND SCREEN
ABO/RH(D): O POS
Antibody Screen: NEGATIVE
Unit division: 0
Unit division: 0

## 2020-04-22 LAB — BPAM RBC
Blood Product Expiration Date: 202110072359
Blood Product Expiration Date: 202110082359
ISSUE DATE / TIME: 202109080411
ISSUE DATE / TIME: 202109080729
Unit Type and Rh: 5100
Unit Type and Rh: 5100

## 2020-04-22 LAB — SURGICAL PATHOLOGY

## 2020-04-22 MED ORDER — OXYCODONE HCL 5 MG PO TABS
5.0000 mg | ORAL_TABLET | Freq: Four times a day (QID) | ORAL | 0 refills | Status: AC | PRN
Start: 2020-04-22 — End: ?

## 2020-04-22 MED ORDER — IBUPROFEN 800 MG PO TABS: 800.0000 mg | ORAL_TABLET | Freq: Three times a day (TID) | ORAL | 1 refills | Status: AC

## 2020-04-22 MED ORDER — DOCUSATE SODIUM 100 MG PO CAPS: 100.0000 mg | ORAL_CAPSULE | Freq: Two times a day (BID) | ORAL | 0 refills | Status: AC

## 2020-04-22 NOTE — Progress Notes (Signed)
POSTPARTUM POSTOP PROGRESS NOTE  POD #2  Subjective:  No acute events overnight.  Pt denies problems with ambulating, voiding or po intake.  She denies nausea or vomiting.  Pain is well controlled.  She has had flatus. She has had bowel movement.  Lochia Minimal. Tolerating anemia without issue, already on PO iron. Both breast and bottle feeding  Objective: Blood pressure 97/70, pulse 61, temperature 98.4 F (36.9 C), temperature source Oral, resp. rate 18, height 5\' 2"  (1.575 m), weight 59 kg, SpO2 100 %, unknown if currently breastfeeding.  Physical Exam:  General: alert, cooperative and no distress Lochia:normal flow Chest: CTAB Heart: RRR no m/r/g Abdomen: +BS, soft, nontender Uterine Fundus: Pressure dressing removed. Fundus firm, 2cm below umbilicus. Honeycomb dressing intact, minimal serosanguinous drainage Extremities: neg edema, neg calf TTP BL, neg Homans BL  Recent Labs    04/21/20 0320 04/21/20 1527  HGB 6.8* 8.9*  HCT 20.5* 26.9*    Assessment/Plan:  ASSESSMENT: Brittany Wilson is a 34 y.o. 34 s/p ERLTCS+BTL @ [redacted]w[redacted]d for h/o csx x1 and satisfied fertility. PNC c/b anemia (HgbE) requiring IV Feraheme. Postop anemia noted, now s/p 2u pRBC, asymptomatic  Discharge home, Breastfeeding and Contraception s/p BTL  Anemia - chronic, s/p 2u pRBC. VS WNL, DC home with continued PO iron/colace   LOS: 2 days

## 2020-04-22 NOTE — Discharge Summary (Signed)
Postpartum Discharge Summary  Date of Service updated     Patient Name: Brittany Wilson DOB: [birth year] MRN: [id number]  Date of admission: 04/20/2020 Delivery date:04/20/2020  Delivering provider: Sherlyn Hay  Date of discharge: 04/22/2020  Admitting diagnosis: Previous cesarean section [Z98.891] Intrauterine pregnancy: [redacted]w[redacted]d     Secondary diagnosis:  Active Problems:   Status post repeat low transverse cesarean section   S/P tubal ligation  Additional problems: Chronic anemia, HgE disease    Discharge diagnosis: Term Pregnancy Delivered                                              Post partum procedures:blood transfusion Augmentation: N/A Complications: None  Hospital course: Sceduled C/S   34 y.o. yo [id number] at [redacted]w[redacted]d was admitted to the hospital 04/20/2020 for scheduled cesarean section with the following indication:Elective Repeat.Delivery details are as follows:  Membrane Rupture Time/Date: 12:08 PM ,04/20/2020   Delivery Method:C-Section, Low Transverse  Details of operation can be found in separate operative note.  Patient had an uncomplicated postpartum course.  She is ambulating, tolerating a regular diet, passing flatus, and urinating well. Patient is discharged home in stable condition on  04/22/20        Newborn Data: Birth date:04/20/2020  Birth time:12:09 PM  Gender:Female  Living status:Living  Apgars:9 ,10  Weight:3370 g     Magnesium Sulfate received: No BMZ received: No Rhophylac:No MMR:No T-DaP:Given prenatally Flu: No Transfusion:Yes for symptomatic anemia beginning POD#1, EBL 726cc from section in setting of chronic anemia  Physical exam  Vitals:   04/21/20 1025 04/21/20 1419 04/21/20 2118 04/22/20 0541  BP: (!) 88/63 (!) 97/59 (!) 88/72 97/70  Pulse: 67 72  61  Resp: $Remo'18 18 16 18  'CleXx$ Temp: 98.2 F (36.8 C) 98.1 F (36.7 C) 98.5 F (36.9 C) 98.4 F (36.9 C)  TempSrc: Axillary Oral  Oral  SpO2: 100% 100% 100% 100%  Weight:      Height:        General: alert, cooperative and no distress Lochia: appropriate Uterine Fundus: firm Incision: Healing well with no significant drainage, No significant erythema, Dressing is clean, dry, and intact DVT Evaluation: No evidence of DVT seen on physical exam. Negative Homan's sign. No cords or calf tenderness. Labs: Lab Results  Component Value Date   WBC 10.8 (H) 04/21/2020   HGB 8.9 (L) 04/21/2020   HCT 26.9 (L) 04/21/2020   MCV 70.4 (L) 04/21/2020   PLT 189 04/21/2020   CMP Latest Ref Rng & Units 04/07/2020  Glucose 70 - 99 mg/dL 95  BUN 6 - 20 mg/dL 7  Creatinine 0.44 - 1.00 mg/dL 0.65  Sodium 135 - 145 mmol/L 136  Potassium 3.5 - 5.1 mmol/L 4.3  Chloride 98 - 111 mmol/L 106  CO2 22 - 32 mmol/L 24  Calcium 8.9 - 10.3 mg/dL 9.4  Total Protein 6.5 - 8.1 g/dL 7.1  Total Bilirubin 0.3 - 1.2 mg/dL 0.6  Alkaline Phos 38 - 126 U/L 117  AST 15 - 41 U/L 24  ALT 0 - 44 U/L 20   Edinburgh Score: Edinburgh Postnatal Depression Scale Screening Tool 04/20/2020  I have been able to laugh and see the funny side of things. 0  I have looked forward with enjoyment to things. 0  I have blamed myself unnecessarily when things went wrong. 0  I have been anxious or worried for no good reason. 0  I have felt scared or panicky for no good reason. 0  Things have been getting on top of me. 0  I have been so unhappy that I have had difficulty sleeping. 0  I have felt sad or miserable. 0  I have been so unhappy that I have been crying. 0  The thought of harming myself has occurred to me. 0  Edinburgh Postnatal Depression Scale Total 0      After visit meds:  Allergies as of 04/22/2020      Reactions   Shellfish Allergy Rash      Medication List    TAKE these medications   B-12 1000 MCG Subl Place 1,000 mcg under the tongue daily.   Benadryl Allergy 25 MG tablet Generic drug: diphenhydrAMINE Take 25 mg by mouth every 6 (six) hours as needed for itching.   docusate sodium 100 MG  capsule Commonly known as: Colace Take 1 capsule (100 mg total) by mouth 2 (two) times daily.   ibuprofen 800 MG tablet Commonly known as: ADVIL Take 1 tablet (800 mg total) by mouth every 8 (eight) hours.   iron polysaccharides 150 MG capsule Commonly known as: NIFEREX Take 1 capsule (150 mg total) by mouth 2 (two) times daily.   loratadine 10 MG tablet Commonly known as: CLARITIN Take 10 mg by mouth daily as needed for allergies.   oxyCODONE 5 MG immediate release tablet Commonly known as: Oxy IR/ROXICODONE Take 1 tablet (5 mg total) by mouth every 6 (six) hours as needed for severe pain or breakthrough pain.   PRENATAL VITAMINS PO Take 1 capsule by mouth daily.            Discharge Care Instructions  (From admission, onward)         Start     Ordered   04/22/20 0000  Leave dressing on - Keep it clean, dry, and intact until clinic visit        04/22/20 0850           Discharge home in stable condition Infant Feeding: Bottle and Breast Infant Disposition:home with mother Discharge instruction: per After Visit Summary and Postpartum booklet. Activity: Advance as tolerated. Pelvic rest for 6 weeks.  Diet: routine diet Anticipated Birth Control: BTL done PP Postpartum Appointment:6 weeks Additional Postpartum F/U: Incision check 2wk Future Appointments: Future Appointments  Date Time Provider [place]  07/16/2020 11:15 AM [facility] None  07/16/2020 11:40 AM Brunetta Genera, MD [facility] None   Follow up Visit:      04/22/2020 Deliah Boston, MD

## 2020-04-22 NOTE — Discharge Instructions (Signed)
      [facility] 4S Mother Baby Unit [address] [place], [place]  [address] Phone:  [phone]   April 22, 2020  Patient: Brittany Wilson  Date of Birth:   Date of Visit: April 22, 2020    To Whom It May Concern:  Jozelyn Kuwahara was seen and treated on September 7-9 at [facility] and [facility].          If you have any questions or concerns, please don't hesitate to call.   Sincerely,       Treatment Team:  Attending Provider: Edwinna Areola, DO

## 2020-07-15 ENCOUNTER — Other Ambulatory Visit: Payer: Self-pay | Admitting: *Deleted

## 2020-07-15 DIAGNOSIS — D509 Iron deficiency anemia, unspecified: Secondary | ICD-10-CM

## 2020-07-15 DIAGNOSIS — O99013 Anemia complicating pregnancy, third trimester: Secondary | ICD-10-CM

## 2020-07-16 ENCOUNTER — Inpatient Hospital Stay: Payer: Medicaid Other | Attending: Hematology | Admitting: Hematology

## 2020-07-16 ENCOUNTER — Other Ambulatory Visit: Payer: Self-pay

## 2020-07-16 ENCOUNTER — Inpatient Hospital Stay: Payer: Medicaid Other

## 2020-07-16 VITALS — BP 115/79 | HR 80 | Temp 98.1°F | Resp 18 | Ht 62.0 in | Wt 113.0 lb

## 2020-07-16 DIAGNOSIS — D509 Iron deficiency anemia, unspecified: Secondary | ICD-10-CM

## 2020-07-16 DIAGNOSIS — O99013 Anemia complicating pregnancy, third trimester: Secondary | ICD-10-CM | POA: Diagnosis not present

## 2020-07-16 DIAGNOSIS — D582 Other hemoglobinopathies: Secondary | ICD-10-CM | POA: Diagnosis not present

## 2020-07-16 DIAGNOSIS — E538 Deficiency of other specified B group vitamins: Secondary | ICD-10-CM | POA: Insufficient documentation

## 2020-07-16 LAB — CMP (CANCER CENTER ONLY)
ALT: 27 U/L (ref 0–44)
AST: 13 U/L — ABNORMAL LOW (ref 15–41)
Albumin: 4.2 g/dL (ref 3.5–5.0)
Alkaline Phosphatase: 48 U/L (ref 38–126)
Anion gap: 7 (ref 5–15)
BUN: 9 mg/dL (ref 6–20)
CO2: 28 mmol/L (ref 22–32)
Calcium: 9.3 mg/dL (ref 8.9–10.3)
Chloride: 105 mmol/L (ref 98–111)
Creatinine: 0.68 mg/dL (ref 0.44–1.00)
GFR, Estimated: >60 mL/min (ref >60)
Glucose, Bld: 92 mg/dL (ref 70–99)
Potassium: 3.7 mmol/L (ref 3.5–5.1)
Sodium: 140 mmol/L (ref 135–145)
Total Bilirubin: 1.5 mg/dL — ABNORMAL HIGH (ref 0.3–1.2)
Total Protein: 7.9 g/dL (ref 6.5–8.1)

## 2020-07-16 LAB — CBC WITH DIFFERENTIAL (CANCER CENTER ONLY)
Abs Immature Granulocytes: 0.02 10*3/uL (ref 0.00–0.07)
Basophils Absolute: 0 10*3/uL (ref 0.0–0.1)
Basophils Relative: 0 %
Eosinophils Absolute: 0.5 10*3/uL (ref 0.0–0.5)
Eosinophils Relative: 7 %
HCT: 33.4 % — ABNORMAL LOW (ref 36.0–46.0)
Hemoglobin: 11.2 g/dL — ABNORMAL LOW (ref 12.0–15.0)
Immature Granulocytes: 0 %
Lymphocytes Relative: 26 %
Lymphs Abs: 1.8 10*3/uL (ref 0.7–4.0)
MCH: 20.7 pg — ABNORMAL LOW (ref 26.0–34.0)
MCHC: 33.5 g/dL (ref 30.0–36.0)
MCV: 61.9 fL — ABNORMAL LOW (ref 80.0–100.0)
Monocytes Absolute: 0.5 10*3/uL (ref 0.1–1.0)
Monocytes Relative: 7 %
Neutro Abs: 4.1 10*3/uL (ref 1.7–7.7)
Neutrophils Relative %: 60 %
Platelet Count: 297 10*3/uL (ref 150–400)
RBC: 5.4 MIL/uL — ABNORMAL HIGH (ref 3.87–5.11)
RDW: 15.7 % — ABNORMAL HIGH (ref 11.5–15.5)
WBC Count: 6.9 10*3/uL (ref 4.0–10.5)
nRBC: 0 % (ref 0.0–0.2)

## 2020-07-16 LAB — IRON AND TIBC
Iron: 114 ug/dL (ref 41–142)
Saturation Ratios: 43 % (ref 21–57)
TIBC: 263 ug/dL (ref 236–444)
UIBC: 149 ug/dL (ref 120–384)

## 2020-07-16 LAB — SAMPLE TO BLOOD BANK

## 2020-07-16 LAB — FERRITIN: Ferritin: 414 ng/mL — ABNORMAL HIGH (ref 11–307)

## 2020-07-16 MED ORDER — B-12 1000 MCG SL SUBL: 1000.0000 ug | SUBLINGUAL_TABLET | Freq: Every day | SUBLINGUAL | 5 refills | Status: AC

## 2020-07-16 MED ORDER — POLYSACCHARIDE IRON COMPLEX 150 MG PO CAPS: 150.0000 mg | ORAL_CAPSULE | Freq: Every day | ORAL | 5 refills | Status: AC

## 2020-07-16 NOTE — Progress Notes (Signed)
HEMATOLOGY/ONCOLOGY CLINIC NOTE  Date of Service: 07/16/2020   Patient Care Team: Patient, No Pcp Per as PCP - General (General Practice)  CHIEF COMPLAINTS/PURPOSE OF CONSULTATION:  Anemia in pregnancy  HISTORY OF PRESENTING ILLNESS:  Brittany Wilson is a wonderful 34 y.o. female who has been referred to [place] by Dr. Reina Fuse for evaluation and management of anemia in pregnancy. We are joined today by her aunt, Brittany Wilson, via phone. The pt reports that she is doing well overall.   The pt reports that she is currently [redacted] weeks pregnant with her fourth child. She has three boys; ages 17, 62, and 2. She has not required blood transfusions or iron infusions with her previous pregnancies. Pt was given PO Iron to take during her last pregnancy and was able to deliver via C-section. Pt is currently only taking prenatal vitamins and Tylenol. Pt was not told to take PO Iron during this pregnancy. She is planning on having an elective C-section with this pregnancy. She will get a tubal ligation after birth. Pt has been told that that her oldest two children have anemia and iron deficiency.   Most recent lab results (02/03/2020) of CBC is as follows: all values are WNL except for Hgb at 8.9, HCT at 28.0, MCV at 66, MCH at 21.0, RDW at 17.1, Eos Abs at 0.07K.  On review of systems, pt reports lightheadedness, dizziness, fatigue, leg swelling and denies abnormal bleeding, abdominal pain and any other symptoms.   On PMHx the pt reports C-section.  On Family Hx the pt reports that her husband's father has Hemoglobin E disease and her nieces have thalassemia.   INTERVAL HISTORY: Brittany Wilson is a wonderful 34 y.o. female who is here for evaluation and management of Hgb E hemoglobinopathy and anemia in pregnancy. The patient's last visit with [place] was on 04/07/2020. The pt reports that she is doing well overall.  The pt reports that she has been feeling well and denies much fatigue. Pt was given 2 units of blood  during the delivery process. She continued taking Iron Polysaccharide and Vitamin B12 daily until she ran out. Pt is not currently breast feeding. Her baby is being seen by a Hematologist and they are currently in the process of completing labwork.   Lab results today (07/16/20) of CBC w/diff and CMP is as follows: all values are WNL except for RBC at 5.40, Hgb at 11.2, HCT at 33.4, MCV at 61.9, MCH at 20.7, RDW at 15.7, AST at 13, Total Bilirubin at 1.5. 07/16/2020 Ferritin at 414 07/16/2020 Iron Panel is as follows: Iron at 114, TIBC at 263, Sat Ratios at 43, UIBC at 149  On review of systems, pt reports occasional lightheadedness and denies worsening fatigue and any other symptoms.   MEDICAL HISTORY:  Past Medical History:  Diagnosis Date  . Failed external cephalic version 01/18/3709  . Malpresentation before onset of labor, fetus 1 03/27/2018  . Medical history non-contributory     SURGICAL HISTORY: Past Surgical History:  Procedure Laterality Date  . CESAREAN SECTION N/A 03/27/2018   Procedure: CESAREAN SECTION;  Surgeon: Sherian Rein, MD;  Location: [facility];  Service: Obstetrics;  Laterality: N/A;  . CESAREAN SECTION WITH BILATERAL TUBAL LIGATION Bilateral 04/20/2020   Procedure: CESAREAN SECTION WITH BILATERAL TUBAL LIGATION;  Surgeon: Edwinna Areola, DO;  Location: [facility];  Service: Obstetrics;  Laterality: Bilateral;  [name], RNFA    SOCIAL HISTORY: Social History   Socioeconomic History  .  Marital status: Single    Spouse name: Not on file  . Number of children: Not on file  . Years of education: Not on file  . Highest education level: Not on file  Occupational History  . Not on file  Tobacco Use  . Smoking status: Never Smoker  . Smokeless tobacco: Never Used  Vaping Use  . Vaping Use: Never used  Substance and Sexual Activity  . Alcohol use: No  . Drug use: No  . Sexual activity: Yes  Other Topics Concern  . Not on file  Social  History Narrative  . Not on file   Social Determinants of Health   Financial Resource Strain:   . Difficulty of Paying Living Expenses: Not on file  Food Insecurity:   . Worried About Programme researcher, broadcasting/film/video in the Last Year: Not on file  . Ran Out of Food in the Last Year: Not on file  Transportation Needs:   . Lack of Transportation (Medical): Not on file  . Lack of Transportation (Non-Medical): Not on file  Physical Activity:   . Days of Exercise per Week: Not on file  . Minutes of Exercise per Session: Not on file  Stress:   . Feeling of Stress : Not on file  Social Connections:   . Frequency of Communication with Friends and Family: Not on file  . Frequency of Social Gatherings with Friends and Family: Not on file  . Attends Religious Services: Not on file  . Active Member of Clubs or Organizations: Not on file  . Attends Banker Meetings: Not on file  . Marital Status: Not on file  Intimate Partner Violence:   . Fear of Current or Ex-Partner: Not on file  . Emotionally Abused: Not on file  . Physically Abused: Not on file  . Sexually Abused: Not on file    FAMILY HISTORY: Family History  Problem Relation Age of Onset  . Liver cancer Maternal Grandfather     ALLERGIES:  is allergic to shellfish allergy.  MEDICATIONS:  Current Outpatient Medications  Medication Sig Dispense Refill  . Cyanocobalamin (B-12) 1000 MCG SUBL Place 1,000 mcg under the tongue daily. 30 tablet 5  . diphenhydrAMINE (BENADRYL ALLERGY) 25 MG tablet Take 25 mg by mouth every 6 (six) hours as needed for itching.    . docusate sodium (COLACE) 100 MG capsule Take 1 capsule (100 mg total) by mouth 2 (two) times daily. 10 capsule 0  . ibuprofen (ADVIL) 800 MG tablet Take 1 tablet (800 mg total) by mouth every 8 (eight) hours. 60 tablet 1  . iron polysaccharides (NIFEREX) 150 MG capsule Take 1 capsule (150 mg total) by mouth daily. 60 capsule 5  . loratadine (CLARITIN) 10 MG tablet Take 10  mg by mouth daily as needed for allergies.    Marland Kitchen oxyCODONE (OXY IR/ROXICODONE) 5 MG immediate release tablet Take 1 tablet (5 mg total) by mouth every 6 (six) hours as needed for severe pain or breakthrough pain. 30 tablet 0   No current facility-administered medications for this visit.    REVIEW OF SYSTEMS:   A 10+ POINT REVIEW OF SYSTEMS WAS OBTAINED including neurology, dermatology, psychiatry, cardiac, respiratory, lymph, extremities, GI, GU, Musculoskeletal, constitutional, breasts, reproductive, HEENT.  All pertinent positives are noted in the HPI.  All others are negative.   PHYSICAL EXAMINATION: ECOG PERFORMANCE STATUS: 0 - Asymptomatic   GENERAL:alert, in no acute distress and comfortable SKIN: no acute rashes, no significant lesions EYES: conjunctiva  are pink and non-injected, sclera anicteric OROPHARYNX: MMM, no exudates, no oropharyngeal erythema or ulceration NECK: supple, no JVD LYMPH:  no palpable lymphadenopathy in the cervical, axillary or inguinal regions LUNGS: clear to auscultation b/l with normal respiratory effort HEART: regular rate & rhythm ABDOMEN:  normoactive bowel sounds , non tender, not distended. No palpable hepatosplenomegaly.  Extremity: no pedal edema PSYCH: alert & oriented x 3 with fluent speech NEURO: no focal motor/sensory deficits  LABORATORY DATA:  I have reviewed the data as listed  . CBC Latest Ref Rng & Units 07/16/2020 04/21/2020 04/21/2020  WBC 4.0 - 10.5 K/uL 6.9 10.8(H) 10.8(H)  Hemoglobin 12.0 - 15.0 g/dL 11.2(L) 8.9(L) 6.8(LL)  Hematocrit 36 - 46 % 33.4(L) 26.9(L) 20.5(L)  Platelets 150 - 400 K/uL 297 189 190    . CMP Latest Ref Rng & Units 07/16/2020 04/07/2020 03/03/2020  Glucose 70 - 99 mg/dL 92 95 75  BUN 6 - 20 mg/dL 9 7 7   Creatinine 0.44 - 1.00 mg/dL 1.610.68 0.960.65 0.450.60  Sodium 135 - 145 mmol/L 140 136 136  Potassium 3.5 - 5.1 mmol/L 3.7 4.3 3.9  Chloride 98 - 111 mmol/L 105 106 106  CO2 22 - 32 mmol/L 28 24 22   Calcium 8.9 - 10.3  mg/dL 9.3 9.4 9.0  Total Protein 6.5 - 8.1 g/dL 7.9 7.1 7.3  Total Bilirubin 0.3 - 1.2 mg/dL 4.0(J1.5(H) 0.6 0.5  Alkaline Phos 38 - 126 U/L 48 117 87  AST 15 - 41 U/L 13(L) 24 14(L)  ALT 0 - 44 U/L 27 20 14    . Lab Results  Component Value Date   IRON 114 07/16/2020   TIBC 263 07/16/2020   IRONPCTSAT 43 07/16/2020   (Iron and TIBC)  Lab Results  Component Value Date   FERRITIN 414 (H) 07/16/2020    Component     Latest Ref Rng & Units 03/03/2020  Folate, Hemolysate     Not Estab. ng/mL 249.0  HCT     34.0 - 46.6 % 29.0 (L)  Folate, RBC     >498 ng/mL 859  Vitamin B12     180 - 914 pg/mL 195   Component     Latest Ref Rng & Units 03/03/2020  HGB F     0.0 - 2.0 % 5.4 (H)  Hgb A     96.4 - 98.8 % 0.0 (L)  HGB A2     1.8 - 3.2 % Comment  HGB S     0.0 % 0.0  HGB C     0.0 % 0.0  Hgb E     0.0 % 90.3 (H)  HGB VARIANT     0.0 % 0.0  Final Interpretation:      Comment   Hgb Fractionation by HPLC      No reference range information available      Resulting Lab: [facility]      Comments:           (NOTE)           Hemoglobin pattern and concentrations are consistent with a           homozygous E patient. Suggest clinical and hematologic            correlation.           Homozygous E Interpretation Ranges           Hgb F   0.0 - 15.0%  Hgb E        >80.0%           Hgb A2       > 5.0%           Performed At: [facility]...  Alpha-Thalassemia Comment:   Comment: (NOTE)  Test: Alpha-Thalassemia, DNA Analysis  Result:    Negative, alpha alpha/alpha alpha     RADIOGRAPHIC STUDIES: I have personally reviewed the radiological images as listed and agreed with the findings in the report. No results found.  ASSESSMENT & PLAN:   34 yo female with   1) Microcytic Anemia  hgb @ 9.  Multifactorial due to Iron deficiency + newly diagnosed Homozygous HgB E  Additional elements causing anemia - hemodlution in 3rd trimester of  pregnancy.  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 -Advised pt that her Hgb is likely at baseline. -Continue 1000 mcg Vitamin B12 and 150 mg Iron Polysaccharide daily -Refill Iron Polysaccharide, Cyanocobalamin -Will see back in 6 months with labs   FOLLOW UP: RTC with Dr Candise Che with labs in 6 months  The total time spent in the appt was 20 minutes and more than 50% was on counseling and direct patient cares.  All of the patient's questions were answered with apparent satisfaction. The patient knows to call the clinic with any problems, questions or concerns.    Wyvonnia Lora MD MS AAHIVMS [facility] Hematology/Oncology Physician [facility]  (Office):       [phone] (Work cell):  [phone] (Fax):           [phone]  07/16/2020 1:09 PM  I, Carollee Herter, am acting as a scribe for Dr. Wyvonnia Lora.   .I have reviewed the above documentation for accuracy and completeness, and I agree with the above. Johney Maine MD

## 2021-03-14 ENCOUNTER — Other Ambulatory Visit: Payer: Self-pay

## 2021-03-14 ENCOUNTER — Emergency Department (HOSPITAL_COMMUNITY)
Admission: EM | Admit: 2021-03-14 | Discharge: 2021-03-15 | Disposition: A | Discharge status: Home / Self Care | Payer: Medicaid Other | Attending: Emergency Medicine | Admitting: Emergency Medicine

## 2021-03-14 ENCOUNTER — Encounter (HOSPITAL_COMMUNITY): Payer: Self-pay

## 2021-03-14 DIAGNOSIS — Z5321 Procedure and treatment not carried out due to patient leaving prior to being seen by health care provider: Secondary | ICD-10-CM | POA: Insufficient documentation

## 2021-03-14 DIAGNOSIS — R109 Unspecified abdominal pain: Secondary | ICD-10-CM | POA: Insufficient documentation

## 2021-03-14 LAB — CBC WITH DIFFERENTIAL/PLATELET
Abs Immature Granulocytes: 0.02 10*3/uL (ref 0.00–0.07)
Basophils Absolute: 0.1 10*3/uL (ref 0.0–0.1)
Basophils Relative: 1 %
Eosinophils Absolute: 0.4 10*3/uL (ref 0.0–0.5)
Eosinophils Relative: 5 %
HCT: 33.7 % — ABNORMAL LOW (ref 36.0–46.0)
Hemoglobin: 11.2 g/dL — ABNORMAL LOW (ref 12.0–15.0)
Immature Granulocytes: 0 %
Lymphocytes Relative: 34 %
Lymphs Abs: 2.6 10*3/uL (ref 0.7–4.0)
MCH: 21 pg — ABNORMAL LOW (ref 26.0–34.0)
MCHC: 33.2 g/dL (ref 30.0–36.0)
MCV: 63.1 fL — ABNORMAL LOW (ref 80.0–100.0)
Monocytes Absolute: 0.4 10*3/uL (ref 0.1–1.0)
Monocytes Relative: 5 %
Neutro Abs: 4.2 10*3/uL (ref 1.7–7.7)
Neutrophils Relative %: 55 %
Platelets: 274 10*3/uL (ref 150–400)
RBC: 5.34 MIL/uL — ABNORMAL HIGH (ref 3.87–5.11)
RDW: 15.8 % — ABNORMAL HIGH (ref 11.5–15.5)
WBC: 7.6 10*3/uL (ref 4.0–10.5)
nRBC: 0 % (ref 0.0–0.2)

## 2021-03-14 LAB — COMPREHENSIVE METABOLIC PANEL
ALT: 15 U/L (ref 0–44)
AST: 15 U/L (ref 15–41)
Albumin: 4.4 g/dL (ref 3.5–5.0)
Alkaline Phosphatase: 35 U/L — ABNORMAL LOW (ref 38–126)
Anion gap: 9 (ref 5–15)
BUN: 9 mg/dL (ref 6–20)
CO2: 23 mmol/L (ref 22–32)
Calcium: 8.7 mg/dL — ABNORMAL LOW (ref 8.9–10.3)
Chloride: 104 mmol/L (ref 98–111)
Creatinine, Ser: 0.58 mg/dL (ref 0.44–1.00)
GFR, Estimated: >60 mL/min (ref >60)
Glucose, Bld: 111 mg/dL — ABNORMAL HIGH (ref 70–99)
Potassium: 3.4 mmol/L — ABNORMAL LOW (ref 3.5–5.1)
Sodium: 136 mmol/L (ref 135–145)
Total Bilirubin: 1 mg/dL (ref 0.3–1.2)
Total Protein: 7.7 g/dL (ref 6.5–8.1)

## 2021-03-14 LAB — I-STAT BETA HCG BLOOD, ED (MC, WL, AP ONLY): I-stat hCG, quantitative: <5 m[IU]/mL (ref <5)

## 2021-03-14 NOTE — ED Triage Notes (Signed)
PT REPORTS abdomen pain that started today while she was at work. Pt reports it is all over.   Pt states she has had stomach bloating before but it usually goes away with traditional "corning" technique after 30 minutes. Today she did the "corning" for 2 hours with no stomach relief.  Last BM today.  Interpreter used for translation during triage.

## 2021-03-15 NOTE — ED Notes (Signed)
Pt turned in her pt stickers and left the facility.
